# Patient Record
Sex: Female | Born: 1990 | Race: Black or African American | Hispanic: No | Marital: Single | Smoking: Never smoker
Health system: Southern US, Community
[De-identification: ages and names within clinical notes are randomized; demographics above are authoritative.]

## PROBLEM LIST (undated history)

## (undated) DIAGNOSIS — J349 Unspecified disorder of nose and nasal sinuses: Secondary | ICD-10-CM

---
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
## 2010-06-12 ENCOUNTER — Emergency Department (HOSPITAL_COMMUNITY): Admission: EM | Admit: 2010-06-12 | Discharge: 2010-06-12 | Payer: Self-pay | Admitting: Family Medicine

## 2010-12-04 ENCOUNTER — Inpatient Hospital Stay (INDEPENDENT_AMBULATORY_CARE_PROVIDER_SITE_OTHER)
Admission: RE | Admit: 2010-12-04 | Discharge: 2010-12-04 | Disposition: A | Discharge status: Home / Self Care | Payer: Self-pay | Source: Ambulatory Visit | Attending: Emergency Medicine | Admitting: Emergency Medicine

## 2010-12-04 DIAGNOSIS — B373 Candidiasis of vulva and vagina: Secondary | ICD-10-CM

## 2010-12-04 DIAGNOSIS — Z3201 Encounter for pregnancy test, result positive: Secondary | ICD-10-CM

## 2010-12-04 LAB — POCT URINALYSIS DIPSTICK
Bilirubin Urine: NEGATIVE
Glucose, UA: NEGATIVE mg/dL
Nitrite: NEGATIVE
Protein, ur: 30 mg/dL — AB
Specific Gravity, Urine: 1.025 (ref 1.005–1.030)
Urobilinogen, UA: 0.2 mg/dL (ref 0.0–1.0)
pH: 6 (ref 5.0–8.0)

## 2010-12-04 LAB — WET PREP, GENITAL: Trich, Wet Prep: NONE SEEN

## 2010-12-04 LAB — POCT PREGNANCY, URINE: Preg Test, Ur: POSITIVE

## 2010-12-05 LAB — GC/CHLAMYDIA PROBE AMP, GENITAL
Chlamydia, DNA Probe: NEGATIVE
GC Probe Amp, Genital: NEGATIVE

## 2010-12-06 LAB — WET PREP, GENITAL
Trich, Wet Prep: NONE SEEN
Yeast Wet Prep HPF POC: NONE SEEN

## 2010-12-06 LAB — POCT URINALYSIS DIPSTICK
Bilirubin Urine: NEGATIVE
Glucose, UA: NEGATIVE mg/dL
Hgb urine dipstick: NEGATIVE
Ketones, ur: 15 mg/dL — AB
pH: 6.5 (ref 5.0–8.0)

## 2010-12-06 LAB — POCT PREGNANCY, URINE: Preg Test, Ur: NEGATIVE

## 2010-12-06 LAB — GC/CHLAMYDIA PROBE AMP, GENITAL: GC Probe Amp, Genital: NEGATIVE

## 2011-06-14 LAB — WET PREP, GENITAL
Clue Cells Wet Prep HPF POC: NONE SEEN
Trich, Wet Prep: NONE SEEN
Yeast Wet Prep HPF POC: NONE SEEN

## 2011-06-14 LAB — POCT URINALYSIS DIP (DEVICE)
Bilirubin Urine: NEGATIVE
Ketones, ur: NEGATIVE
Operator id: 247071
Protein, ur: 100 — AB
Specific Gravity, Urine: 1.02

## 2011-06-14 LAB — GC/CHLAMYDIA PROBE AMP, GENITAL: GC Probe Amp, Genital: NEGATIVE

## 2011-08-09 ENCOUNTER — Emergency Department (INDEPENDENT_AMBULATORY_CARE_PROVIDER_SITE_OTHER)
Admission: EM | Admit: 2011-08-09 | Discharge: 2011-08-09 | Disposition: A | Discharge status: Home / Self Care | Payer: Self-pay | Source: Home / Self Care | Attending: Family Medicine | Admitting: Family Medicine

## 2011-08-09 DIAGNOSIS — Z331 Pregnant state, incidental: Secondary | ICD-10-CM

## 2011-08-09 DIAGNOSIS — N760 Acute vaginitis: Secondary | ICD-10-CM

## 2011-08-09 LAB — POCT URINALYSIS DIP (DEVICE)
Glucose, UA: NEGATIVE mg/dL
Hgb urine dipstick: NEGATIVE
Nitrite: NEGATIVE
Protein, ur: NEGATIVE mg/dL
Specific Gravity, Urine: 1.02 (ref 1.005–1.030)
Urobilinogen, UA: 0.2 mg/dL (ref 0.0–1.0)
pH: 7 (ref 5.0–8.0)

## 2011-08-09 LAB — WET PREP, GENITAL

## 2011-08-09 LAB — POCT PREGNANCY, URINE: Preg Test, Ur: POSITIVE

## 2011-08-09 MED ORDER — PRENATAL RX 60-1 MG PO TABS: 1.0000 | ORAL_TABLET | Freq: Every day | ORAL | Status: DC

## 2011-08-09 NOTE — ED Provider Notes (Signed)
History     CSN: [id number] Arrival date & time: 08/09/2011 12:48 PM   First MD Initiated Contact with Patient 08/09/11 1256      Chief Complaint  Patient presents with  . Vaginal Discharge    (Consider location/radiation/quality/duration/timing/severity/associated sxs/prior treatment) Patient is a 20 y.o. female presenting with vaginal discharge. The history is provided by the patient.  Vaginal Discharge This is a new problem. The current episode started more than 2 days ago. The problem occurs constantly. The problem has not changed since onset.Pertinent negatives include no abdominal pain. Associated symptoms comments: Discharge is white and creamy. No odor, itching or irritation. Current partner x 11 mos. Pt also concerned she may be pregnant. LMP 1st week of Oct. Has breast tenderness and swelling. . The symptoms are aggravated by nothing. She has tried nothing for the symptoms.    History reviewed. No pertinent past medical history.  History reviewed. No pertinent past surgical history.  No family history on file.  History  Substance Use Topics  . Smoking status: Never Smoker   . Smokeless tobacco: Not on file  . Alcohol Use: No    OB History    Grav Para Term Preterm Abortions TAB SAB Ect Mult Living                  Review of Systems  Constitutional: Negative for fever and chills.  Gastrointestinal: Negative for nausea, vomiting, abdominal pain, diarrhea and constipation.  Genitourinary: Positive for vaginal discharge. Negative for dysuria, urgency, frequency, vaginal bleeding, vaginal pain and pelvic pain.    Allergies  Review of patient's allergies indicates no known allergies.  Home Medications   Current Outpatient Rx  Name Route Sig Dispense Refill  . PRENATAL RX 60-1 MG PO TABS Oral Take 1 tablet by mouth daily. 30 tablet 0    BP 109/66  Pulse 88  Temp(Src) 97.8 F (36.6 C) (Oral)  Resp 17  SpO2 100%  LMP 07/02/2011  Physical Exam  Nursing  note and vitals reviewed. Constitutional: She appears well-developed and well-nourished. No distress.  Cardiovascular: Normal rate, regular rhythm and normal heart sounds.   Pulmonary/Chest: Effort normal and breath sounds normal. No respiratory distress.  Abdominal: Soft. Bowel sounds are normal. She exhibits no mass. There is no tenderness.  Genitourinary: Uterus normal. There is no rash, tenderness or lesion on the right labia. There is no rash, tenderness or lesion on the left labia. Cervix exhibits no motion tenderness, no discharge and no friability. Right adnexum displays no mass, no tenderness and no fullness. Left adnexum displays no mass, no tenderness and no fullness. No erythema around the vagina. No signs of injury around the vagina. Vaginal discharge found.  Skin: Skin is warm and dry.  Psychiatric: She has a normal mood and affect.    ED Course  Procedures (including critical care time)   Labs Reviewed  POCT URINALYSIS DIP (DEVICE)  POCT PREGNANCY, URINE  POCT URINALYSIS DIPSTICK  POCT PREGNANCY, URINE  GC/CHLAMYDIA PROBE AMP, GENITAL  WET PREP, GENITAL   No results found.   1. Vaginitis   2. Pregnancy as incidental finding       MDM  UA & urine preg reviewed. GC/chlamydia & wet prep pending.  Pt advised of pos preg test. She states she is planning to terminate pregnancy. Advised pt that if she reconsiders that she needs to schedule OB f/u and to begin taking PNV while considering options.        Melody Comas,  PA 08/09/11 1354  Melody Comas, PA 08/09/11 2304

## 2011-08-09 NOTE — ED Notes (Signed)
C/o vaginal discharge without odor, itching or urinary sx since 08/02/11

## 2011-08-10 LAB — GC/CHLAMYDIA PROBE AMP, GENITAL: Chlamydia, DNA Probe: NEGATIVE

## 2011-08-10 NOTE — ED Provider Notes (Signed)
Medical screening examination/treatment/procedure(s) were performed by non-physician practitioner and as supervising physician I was immediately available for consultation/collaboration.   Barkley Bruns MD.    Barkley Bruns, MD 08/10/11 [phone]

## 2012-03-12 ENCOUNTER — Emergency Department (HOSPITAL_COMMUNITY)
Admission: EM | Admit: 2012-03-12 | Discharge: 2012-03-12 | Disposition: A | Discharge status: Home / Self Care | Payer: Self-pay | Attending: Emergency Medicine | Admitting: Emergency Medicine

## 2012-03-12 DIAGNOSIS — X58XXXA Exposure to other specified factors, initial encounter: Secondary | ICD-10-CM | POA: Insufficient documentation

## 2012-03-12 DIAGNOSIS — Z91010 Allergy to peanuts: Secondary | ICD-10-CM | POA: Insufficient documentation

## 2012-03-12 DIAGNOSIS — T783XXA Angioneurotic edema, initial encounter: Secondary | ICD-10-CM | POA: Insufficient documentation

## 2012-03-12 MED ORDER — FAMOTIDINE 20 MG PO TABS: 20.0000 mg | ORAL_TABLET | Freq: Two times a day (BID) | ORAL | Status: DC

## 2012-03-12 MED ORDER — FAMOTIDINE 20 MG PO TABS
20.0000 mg | ORAL_TABLET | Freq: Once | ORAL | Status: AC
  Administered 2012-03-12: 20 mg via ORAL
  Filled 2012-03-12: qty 1

## 2012-03-12 MED ORDER — DIPHENHYDRAMINE HCL 25 MG PO CAPS
25.0000 mg | ORAL_CAPSULE | Freq: Once | ORAL | Status: AC
  Administered 2012-03-12: 25 mg via ORAL
  Filled 2012-03-12: qty 1

## 2012-03-12 MED ORDER — PREDNISONE 20 MG PO TABS
60.0000 mg | ORAL_TABLET | Freq: Once | ORAL | Status: AC
  Administered 2012-03-12: 60 mg via ORAL
  Filled 2012-03-12: qty 3

## 2012-03-12 MED ORDER — DIPHENHYDRAMINE HCL 25 MG PO CAPS: 25.0000 mg | ORAL_CAPSULE | Freq: Four times a day (QID) | ORAL | Status: DC | PRN

## 2012-03-12 NOTE — Discharge Instructions (Signed)
Continue benadryl and pepcid as prescribed for reaction. Avoid those yogurt bars. Follow up with your doctor as needed for recheck. Return if symptoms are worsening, swelling worsens, have shortness of breath, swelling of the tongue.  Angioedema Angioedema (AE) is a sudden swelling of the eyelids, lips, lobes of ears, external genitalia, skin, and other parts of the body. AE can happen by itself. It usually begins during the night and is found on awakening. It can happen with hives and other allergic reactions. Attacks can be mild and annoying, or life-threatening if the air passages swell. AE generally occurs in a short time period (over minutes to hours) and gets better in 24 to 48 hours. It usually does not cause any serious problems.  There are 2 different kinds of AE:   Allergic AE.   Nonallergic AE.   There may be an overreaction or direct stimulation of cells that are a part of the immune system (mast cells).   There may be problems with the release of chemicals made by the body that cause swelling and inflammation (kinins). AE due to kinins can be inherited from parents (hereditary), or it can develop on its own (acquired). Acquired AE either shows up before, or along with, certain diseases or is due to the body's immune system attacking parts of the body's own cells (autoimmune).  CAUSES  Allergic  AE due to allergic reactions are caused by something that causes the body to react (trigger). Common triggers include:   Foods.   Medicines.   Latex.   Direct contact with certain fruits, vegetables, or animal saliva.   Insect stings.  Nonallergic  Mast cell stimulation may be caused by:   Medicines.   Dyes used in X-rays.   The body's own immune system reactions to parts of the body (autoimmune disease).   Possibly, some virus infections.   AE due to problems with kinins can be hereditary or acquired. Attacks are triggered by:   Mild injury.   Dental work or any  surgery.   Stress.   Sudden changes in temperature.   Exercise.   Medicines.   AE due to problems with kinins can also be due to certain medicines, especially blood pressure medicines like angiotensin-converting enzyme (ACE) inhibitors. African Americans are at nearly 5 times greater risk of developing AE than Caucasians from ACE inhibitors.  SYMPTOMS  Allergic symptoms:  Non-itchy swelling of the skin. Often the swelling is on the face and lips, but any area of the skin can swell. Sometimes, the swelling can be painful. If hives are present, there is intense itching.   Breathing problems if the air passages swell.  Nonallergic symptoms:  If internal organs are involved, there may be:   Nausea.   Abdominal pain.   Vomiting.   Difficulty swallowing.   Difficulty passing urine.   Breathing problems if the air passages swell.  Depending on the cause of AE, episodes may:  Only happen once (if triggers are removed or avoided).   Come back in unpredictable patterns.   Repeat for several years and then gradually fade away.  DIAGNOSIS  AE is diagnosed by:   Asking questions to find out how fast the symptoms began.   Taking a family history.   Physical exam.   Diagnostic tests. Tests could include:   Allergy skin tests to see if the problem is allergic.   Blood tests to diagnose hereditary and some acquired types of AE.   Other tests to see if there  is a hidden disease leading to the AE.  TREATMENT  Treatment depends on the type and cause (if any) of the AE. Allergic  Allergic types of AE are treated with:   Immediate removal of the trigger or medicine (if any).   Epinephrine injection.   Steroids.   Antihistamines.   Hospitalization for severe attacks.  Nonallergic  Mast cell stimulation types of AE are treated with:   Immediate removal of the trigger or medicine (if any).   Epinephrine injection.   Steroids.   Antihistamines.    Hospitalization for severe attacks.   Hereditary AE is treated with:   Medicines to prevent and treat attacks. There is little response to antihistamines, epinephrine, or steroids.   Preventive medicines before dental work or surgery.   Removing or avoiding medicines that trigger attacks.   Hospitalization for severe attacks.   Acquired AE is treated with:   Treating underlying disease (if any).   Medicines to prevent and treat attacks.  HOME CARE INSTRUCTIONS   Always carry your emergency allergy treatment medicines with you.   Wear a medical bracelet.   Avoid known triggers.  SEEK MEDICAL CARE IF:   You get repeat attacks.   Your attacks are more frequent or more severe despite preventive measures.   You have hereditary AE and are considering having children. It is important to discuss the risks of passing this on to your children.  SEEK IMMEDIATE MEDICAL CARE IF:   You have difficulty breathing.   You have difficulty swallowing.   You experience fainting.  This condition should be treated immediately. It can be life-threatening if it involves throat swelling. Document Released: 11/18/2001 Document Revised: 08/29/2011 Document Reviewed: 09/08/2008 [facility] Patient Information 2012 [place], [place].

## 2012-03-12 NOTE — ED Provider Notes (Signed)
History     CSN: [id number]  Arrival date & time 03/12/12  0272   First MD Initiated Contact with Patient 03/12/12 [phone]      Chief Complaint  Patient presents with  . Oral Swelling    (Consider location/radiation/quality/duration/timing/severity/associated sxs/prior treatment) Patient is a 21 y.o. female presenting with allergic reaction. The history is provided by the patient.  Allergic Reaction The primary symptoms do not include shortness of breath, abdominal pain, nausea, vomiting, dizziness, rash or urticaria. The current episode started 1 to 2 hours ago. The problem has been gradually worsening. This is a new problem.  Pt states she ate a yogurt bar this morning, states about 5-10 min later started having lip swelling. State she has had that same bar before and no reaction. Denies swelling in the throat, shortness of breath. States she is allergic to peanuts with similar reaction. No other foods, no new products, was at work when this happened.   No past medical history on file.  No past surgical history on file.  No family history on file.  History  Substance Use Topics  . Smoking status: Never Smoker   . Smokeless tobacco: Not on file  . Alcohol Use: No    OB History    Grav Para Term Preterm Abortions TAB SAB Ect Mult Living                  Review of Systems  Constitutional: Negative for fever and chills.  HENT: Positive for facial swelling. Negative for drooling, mouth sores, trouble swallowing and neck stiffness.   Respiratory: Negative.  Negative for shortness of breath and stridor.   Cardiovascular: Negative.   Gastrointestinal: Negative for nausea, vomiting and abdominal pain.  Skin: Negative for rash.  Neurological: Negative for dizziness and light-headedness.    Allergies  Peanuts  Home Medications   Current Outpatient Rx  Name Route Sig Dispense Refill  . HYDROCORTISONE 1 % EX CREA Topical Apply 1 application topically 2 (two) times daily. For  eczema    . IBUPROFEN 200 MG PO TABS Oral Take 400 mg by mouth every 6 (six) hours as needed. For pain    . PRESCRIPTION MEDICATION Oral Take 1 tablet by mouth daily. Birth control      BP 101/76  Pulse 75  Temp 97.9 F (36.6 C) (Oral)  Resp 16  Ht 5\' 1"  (1.549 m)  Wt 140 lb (63.504 kg)  BMI 26.45 kg/m2  SpO2 100%  LMP 02/14/2012  Physical Exam  Nursing note and vitals reviewed. Constitutional: She appears well-developed and well-nourished. No distress.  HENT:  Head: Normocephalic and atraumatic.  Right Ear: Tympanic membrane, external ear and ear canal normal.  Left Ear: Tympanic membrane, external ear and ear canal normal.  Nose: Nose normal.  Mouth/Throat: Uvula is midline, oropharynx is clear and moist and mucous membranes are normal. No uvula swelling.    Neck: Normal range of motion. Neck supple.  Cardiovascular: Normal rate, regular rhythm and normal heart sounds.   Pulmonary/Chest: Effort normal and breath sounds normal. She has no wheezes. She has no rales.       No stridor  Lymphadenopathy:    She has no cervical adenopathy.  Neurological: She is alert.  Skin: Skin is warm and dry. No rash noted.  Psychiatric: She has a normal mood and affect.    ED Course  Procedures (including critical care time)  Pt with partial left lower lip swelling,no other symptoms. Suspect an allergic reaction. No  uvula swelling, no respiratory problems.    Pt watched for 2 hrs, swelling of the lip improved with prednisone, benadryl, pepcid. Pt's vs normal.pt stable for discharge. Instructed to return if worsening.     1. Angioedema of lips       MDM          Lottie Mussel, PA 03/12/12 1606

## 2012-03-12 NOTE — ED Notes (Signed)
Pt ate [employer] yoghurt bar at 815 am and left lower lip corner started swelling. Pt is talking, no other complaints. Pt is allergic to peanuts. Allergy is mild with some swelling in the past.

## 2012-03-12 NOTE — ED Provider Notes (Signed)
Medical screening examination/treatment/procedure(s) were performed by non-physician practitioner and as supervising physician I was immediately available for consultation/collaboration.   Jamelah Sitzer A Jackline Castilla, MD 03/12/12 1617 

## 2013-02-07 ENCOUNTER — Emergency Department (HOSPITAL_BASED_OUTPATIENT_CLINIC_OR_DEPARTMENT_OTHER)
Admission: EM | Admit: 2013-02-07 | Discharge: 2013-02-07 | Disposition: A | Discharge status: Home / Self Care | Payer: Self-pay | Attending: Emergency Medicine | Admitting: Emergency Medicine

## 2013-02-07 ENCOUNTER — Encounter (HOSPITAL_BASED_OUTPATIENT_CLINIC_OR_DEPARTMENT_OTHER): Payer: Self-pay | Admitting: Emergency Medicine

## 2013-02-07 DIAGNOSIS — J302 Other seasonal allergic rhinitis: Secondary | ICD-10-CM

## 2013-02-07 DIAGNOSIS — J3489 Other specified disorders of nose and nasal sinuses: Secondary | ICD-10-CM | POA: Insufficient documentation

## 2013-02-07 DIAGNOSIS — J3089 Other allergic rhinitis: Secondary | ICD-10-CM | POA: Insufficient documentation

## 2013-02-07 DIAGNOSIS — Z79899 Other long term (current) drug therapy: Secondary | ICD-10-CM | POA: Insufficient documentation

## 2013-02-07 LAB — RAPID STREP SCREEN (MED CTR MEBANE ONLY): Streptococcus, Group A Screen (Direct): NEGATIVE

## 2013-02-07 NOTE — ED Notes (Signed)
Pt c/o nasal congestion and sore throat since 1 am.  Denies fever.  Pt states she has sensation of something in throat on left side.

## 2013-02-07 NOTE — ED Provider Notes (Signed)
History     CSN: [id number]  Arrival date & time 02/07/13  1037   First MD Initiated Contact with Patient 02/07/13 1052      Chief Complaint  Patient presents with  . Sore Throat  . Nasal Congestion    (Consider location/radiation/quality/duration/timing/severity/associated sxs/prior treatment) HPI Comments: Patient comes to the ER for evaluation of sinus congestion, sore throat since early this morning. Patient reports that her glands were swollen under her jaw as well. She has not had any fever. There is no cough or chest congestion.  Patient is a 22 y.o. female presenting with pharyngitis.  Sore Throat    No past medical history on file.  No past surgical history on file.  No family history on file.  History  Substance Use Topics  . Smoking status: Never Smoker   . Smokeless tobacco: Not on file  . Alcohol Use: No    OB History   Grav Para Term Preterm Abortions TAB SAB Ect Mult Living                  Review of Systems  HENT: Positive for sore throat.   Respiratory: Negative for cough.     Allergies  Peanuts  Home Medications   Current Outpatient Rx  Name  Route  Sig  Dispense  Refill  . Norgestimate-Ethinyl Estradiol Triphasic (ORTHO TRI-CYCLEN LO) 0.18/0.215/0.25 MG-25 MCG tab   Oral   Take 1 tablet by mouth daily.         Marland Kitchen EXPIRED: diphenhydrAMINE (BENADRYL) 25 mg capsule   Oral   Take 1 capsule (25 mg total) by mouth every 6 (six) hours as needed for itching.   30 capsule   0   . famotidine (PEPCID) 20 MG tablet   Oral   Take 1 tablet (20 mg total) by mouth 2 (two) times daily.   30 tablet   0     BP 109/64  Pulse 93  Temp(Src) 98.5 F (36.9 C)  Resp 18  SpO2 100%  LMP 01/13/2013  Physical Exam  Constitutional: She is oriented to person, place, and time. She appears well-developed and well-nourished. No distress.  HENT:  Head: Normocephalic and atraumatic.  Right Ear: Hearing normal.  Left Ear: Hearing normal.  Nose:  Nose normal.  Mouth/Throat: Oropharynx is clear and moist and mucous membranes are normal. No oropharyngeal exudate, posterior oropharyngeal edema or tonsillar abscesses.  Eyes: Conjunctivae and EOM are normal. Pupils are equal, round, and reactive to light.  Neck: Normal range of motion. Neck supple.  Cardiovascular: Regular rhythm, S1 normal and S2 normal.  Exam reveals no gallop and no friction rub.   No murmur heard. Pulmonary/Chest: Effort normal and breath sounds normal. No respiratory distress. She exhibits no tenderness.  Abdominal: Soft. Normal appearance and bowel sounds are normal. There is no hepatosplenomegaly. There is no tenderness. There is no rebound, no guarding, no tenderness at McBurney's point and negative Murphy's sign. No hernia.  Musculoskeletal: Normal range of motion.  Neurological: She is alert and oriented to person, place, and time. She has normal strength. No cranial nerve deficit or sensory deficit. Coordination normal. GCS eye subscore is 4. GCS verbal subscore is 5. GCS motor subscore is 6.  Skin: Skin is warm, dry and intact. No rash noted. No cyanosis.  Psychiatric: She has a normal mood and affect. Her speech is normal and behavior is normal. Thought content normal.    ED Course  Procedures (including critical care time)  Labs  Reviewed  RAPID STREP SCREEN   No results found.   Diagnosis: Pharyngitis, likely allergic    MDM  She comes to the ER for evaluation of nasal congestion and sore throat for less than one day. Examination was entirely unremarkable. Rapid strep was negative. Patient does report that she has a history of seasonal allergies and likely is experiencing some pharyngitis secondary to allergic postnasal drip.       Gilda Crease, MD 02/07/13 1128

## 2013-06-28 ENCOUNTER — Encounter (HOSPITAL_BASED_OUTPATIENT_CLINIC_OR_DEPARTMENT_OTHER): Payer: Self-pay | Admitting: *Deleted

## 2013-06-28 ENCOUNTER — Emergency Department (HOSPITAL_BASED_OUTPATIENT_CLINIC_OR_DEPARTMENT_OTHER)
Admission: EM | Admit: 2013-06-28 | Discharge: 2013-06-28 | Disposition: A | Discharge status: Home / Self Care | Payer: BC Managed Care – PPO | Attending: Emergency Medicine | Admitting: Emergency Medicine

## 2013-06-28 DIAGNOSIS — IMO0002 Reserved for concepts with insufficient information to code with codable children: Secondary | ICD-10-CM | POA: Insufficient documentation

## 2013-06-28 DIAGNOSIS — J329 Chronic sinusitis, unspecified: Secondary | ICD-10-CM | POA: Insufficient documentation

## 2013-06-28 MED ORDER — AMOXICILLIN 500 MG PO CAPS: 500.0000 mg | ORAL_CAPSULE | Freq: Three times a day (TID) | ORAL | Status: DC

## 2013-06-28 NOTE — ED Provider Notes (Signed)
Medical screening examination/treatment/procedure(s) were performed by non-physician practitioner and as supervising physician I was immediately available for consultation/collaboration.   Candyce Churn, MD 06/28/13 1018

## 2013-06-28 NOTE — ED Notes (Signed)
Swelling to left side of face noticed since this am. States has seasonal allergies and has had congestion for "a while" now. No difficulty breathing.

## 2013-06-28 NOTE — Discharge Instructions (Signed)

## 2013-06-28 NOTE — ED Notes (Signed)
Pt reports swelling to left side of face and congestion since last night. Airway intact.

## 2013-06-28 NOTE — ED Provider Notes (Signed)
CSN: [id number]     Arrival date & time 06/28/13  0903 History   First MD Initiated Contact with Patient 06/28/13 [phone]     Chief Complaint  Patient presents with  . Facial Swelling  . Nasal Congestion   (Consider location/radiation/quality/duration/timing/severity/associated sxs/prior Treatment) HPI Comments: Pt states that she has had a lot of congestion for the last week and then she developed some facial swelling and pressure on the left side since yesterday;denies fever:pt states that she has history of sinusitis:denies sore throat  The history is provided by the patient. No language interpreter was used.    History reviewed. No pertinent past medical history. History reviewed. No pertinent past surgical history. No family history on file. History  Substance Use Topics  . Smoking status: Never Smoker   . Smokeless tobacco: Not on file  . Alcohol Use: No   OB History   Grav Para Term Preterm Abortions TAB SAB Ect Mult Living                 Review of Systems  Constitutional: Negative.   Respiratory: Negative.   Cardiovascular: Negative.     Allergies  Ibuprofen and Peanuts  Home Medications   Current Outpatient Rx  Name  Route  Sig  Dispense  Refill  . amoxicillin (AMOXIL) 500 MG capsule   Oral   Take 1 capsule (500 mg total) by mouth 3 (three) times daily.   30 capsule   0   . EXPIRED: diphenhydrAMINE (BENADRYL) 25 mg capsule   Oral   Take 1 capsule (25 mg total) by mouth every 6 (six) hours as needed for itching.   30 capsule   0   . EXPIRED: famotidine (PEPCID) 20 MG tablet   Oral   Take 1 tablet (20 mg total) by mouth 2 (two) times daily.   30 tablet   0   . Norgestimate-Ethinyl Estradiol Triphasic (ORTHO TRI-CYCLEN LO) 0.18/0.215/0.25 MG-25 MCG tab   Oral   Take 1 tablet by mouth daily.          BP 107/60  Pulse 90  Temp(Src) 98.2 F (36.8 C) (Oral)  Resp 16  Ht 5\' 1"  (1.549 m)  Wt 138 lb (62.596 kg)  BMI 26.09 kg/m2  SpO2 100%  LMP  06/24/2013 Physical Exam  Nursing note and vitals reviewed. Constitutional: She is oriented to person, place, and time. She appears well-developed and well-nourished.  HENT:  Right Ear: External ear normal.  Left Ear: External ear normal.  Nose: Mucosal edema and sinus tenderness present. Left sinus exhibits maxillary sinus tenderness and frontal sinus tenderness.  Eyes: Conjunctivae and EOM are normal.  Cardiovascular: Normal rate and regular rhythm.   Pulmonary/Chest: Effort normal and breath sounds normal.  Musculoskeletal: Normal range of motion.  Neurological: She is alert and oriented to person, place, and time.  Skin:  Red non crusted area to the below the nose    ED Course  Procedures (including critical care time) Labs Review Labs Reviewed - No data to display Imaging Review No results found.  MDM   1. Sinusitis    Will treat with antibiotics    Teressa Lower, NP 06/28/13 [phone]

## 2013-08-16 ENCOUNTER — Encounter (HOSPITAL_BASED_OUTPATIENT_CLINIC_OR_DEPARTMENT_OTHER): Payer: Self-pay | Admitting: Emergency Medicine

## 2013-08-16 ENCOUNTER — Emergency Department (HOSPITAL_BASED_OUTPATIENT_CLINIC_OR_DEPARTMENT_OTHER)
Admission: EM | Admit: 2013-08-16 | Discharge: 2013-08-16 | Disposition: A | Discharge status: Home / Self Care | Payer: BC Managed Care – PPO | Attending: Emergency Medicine | Admitting: Emergency Medicine

## 2013-08-16 DIAGNOSIS — Z3202 Encounter for pregnancy test, result negative: Secondary | ICD-10-CM | POA: Insufficient documentation

## 2013-08-16 DIAGNOSIS — Z792 Long term (current) use of antibiotics: Secondary | ICD-10-CM | POA: Insufficient documentation

## 2013-08-16 DIAGNOSIS — Z79899 Other long term (current) drug therapy: Secondary | ICD-10-CM | POA: Insufficient documentation

## 2013-08-16 DIAGNOSIS — B3731 Acute candidiasis of vulva and vagina: Secondary | ICD-10-CM | POA: Insufficient documentation

## 2013-08-16 DIAGNOSIS — Z8709 Personal history of other diseases of the respiratory system: Secondary | ICD-10-CM | POA: Insufficient documentation

## 2013-08-16 DIAGNOSIS — B373 Candidiasis of vulva and vagina: Secondary | ICD-10-CM

## 2013-08-16 HISTORY — DX: Unspecified disorder of nose and nasal sinuses: J34.9

## 2013-08-16 LAB — URINALYSIS, ROUTINE W REFLEX MICROSCOPIC
Bilirubin Urine: NEGATIVE
Glucose, UA: NEGATIVE mg/dL
Ketones, ur: NEGATIVE mg/dL
Nitrite: NEGATIVE
Specific Gravity, Urine: 1.025 (ref 1.005–1.030)
Urobilinogen, UA: 0.2 mg/dL (ref 0.0–1.0)
pH: 7 (ref 5.0–8.0)

## 2013-08-16 LAB — WET PREP, GENITAL
Clue Cells Wet Prep HPF POC: NONE SEEN
Trich, Wet Prep: NONE SEEN

## 2013-08-16 LAB — URINE MICROSCOPIC-ADD ON

## 2013-08-16 LAB — PREGNANCY, URINE: Preg Test, Ur: NEGATIVE

## 2013-08-16 MED ORDER — FLUCONAZOLE 50 MG PO TABS
150.0000 mg | ORAL_TABLET | Freq: Once | ORAL | Status: AC
  Administered 2013-08-16: 150 mg via ORAL
  Filled 2013-08-16 (×2): qty 1

## 2013-08-16 MED ORDER — FLUCONAZOLE 150 MG PO TABS: 150.0000 mg | ORAL_TABLET | Freq: Once | ORAL | Status: DC

## 2013-08-16 NOTE — ED Notes (Signed)
Pelvic cart set up at bedside  

## 2013-08-16 NOTE — ED Provider Notes (Signed)
CSN: [id number]     Arrival date & time 08/16/13  1537 History  This chart was scribed for Cynthia Churn, MD by Leone Payor, ED Scribe. This patient was seen in room MH06/MH06 and the patient's care was started 3:55 PM.    Chief Complaint  Patient presents with  . Vaginitis    Patient is a 22 y.o. female presenting with vaginal itching and vaginal discharge. The history is provided by the patient. No language interpreter was used.  Vaginal Itching This is a recurrent problem. The current episode started more than 2 days ago. The problem occurs constantly. The problem has been gradually worsening. Pertinent negatives include no abdominal pain. Nothing aggravates the symptoms. Nothing relieves the symptoms. She has tried nothing for the symptoms. The treatment provided no relief.  Vaginal Discharge Quality:  White Severity:  Moderate Onset quality:  Gradual Duration:  1 day Timing:  Constant Progression:  Worsening Chronicity:  Recurrent Context: recent antibiotic use   Relieved by:  Nothing Worsened by:  Nothing tried Ineffective treatments:  None tried Associated symptoms: vaginal itching   Associated symptoms: no abdominal pain     HPI Comments: Cynthia Matthews is a 22 y.o. female who presents to the Emergency Department complaining of constant, worsened vaginal pain and itching that began 5 days ago. She also reports having vaginal discharge that she describes as white and creamy that began yesterday. Pt states she was recently taking antibiotics for a sinus infection. She states she has had similar symptoms with past yeast infections. She is sexually active and has mostly protected intercourse with 1 partner. Her LNMP was November 1. She denies genital sore, abdominal pain.   Past Medical History  Diagnosis Date  . Sinus disorder    History reviewed. No pertinent past surgical history. No family history on file. History  Substance Use Topics  . Smoking status: Never  Smoker   . Smokeless tobacco: Not on file  . Alcohol Use: No   OB History   Grav Para Term Preterm Abortions TAB SAB Ect Mult Living                 Review of Systems  Gastrointestinal: Negative for abdominal pain.  Genitourinary: Positive for vaginal discharge and vaginal pain. Negative for genital sores.  All other systems reviewed and are negative.    Allergies  Ibuprofen and Peanuts  Home Medications   Current Outpatient Rx  Name  Route  Sig  Dispense  Refill  . loratadine-pseudoephedrine (CLARITIN-D 24-HOUR) 10-240 MG per 24 hr tablet   Oral   Take 1 tablet by mouth daily.         Marland Kitchen amoxicillin (AMOXIL) 500 MG capsule   Oral   Take 1 capsule (500 mg total) by mouth 3 (three) times daily.   30 capsule   0   . EXPIRED: diphenhydrAMINE (BENADRYL) 25 mg capsule   Oral   Take 1 capsule (25 mg total) by mouth every 6 (six) hours as needed for itching.   30 capsule   0   . EXPIRED: famotidine (PEPCID) 20 MG tablet   Oral   Take 1 tablet (20 mg total) by mouth 2 (two) times daily.   30 tablet   0   . Norgestimate-Ethinyl Estradiol Triphasic (ORTHO TRI-CYCLEN LO) 0.18/0.215/0.25 MG-25 MCG tab   Oral   Take 1 tablet by mouth daily.          BP 125/76  Pulse 96  Temp(Src) 98.5 F (  36.9 C) (Oral)  Resp 17  Wt 137 lb (62.143 kg)  SpO2 100%  LMP 08/02/2013 Physical Exam  Nursing note and vitals reviewed. Constitutional: She is oriented to person, place, and time. She appears well-developed and well-nourished. No distress.  HENT:  Head: Normocephalic and atraumatic.  Eyes: Conjunctivae are normal. No scleral icterus.  Neck: Neck supple.  Cardiovascular: Normal rate and intact distal pulses.   Pulmonary/Chest: Effort normal. No stridor. No respiratory distress.  Abdominal: Soft. Normal appearance. She exhibits no distension. There is no tenderness.  Genitourinary: There is no lesion on the right labia. There is no lesion on the left labia. Cervix exhibits  no motion tenderness.  No external lesions. No pelvic tenderness. No CMT. Moderate amount of white, curd-like discharge.   Neurological: She is alert and oriented to person, place, and time.  Skin: Skin is warm and dry. No rash noted.  Psychiatric: She has a normal mood and affect. Her behavior is normal.    ED Course  Procedures   DIAGNOSTIC STUDIES: Oxygen Saturation is 100% on RA, normal by my interpretation.    COORDINATION OF CARE: 4:12 PM Discussed treatment plan with pt at bedside and pt agreed to plan.    Labs Review Labs Reviewed  URINALYSIS, ROUTINE W REFLEX MICROSCOPIC - Abnormal; Notable for the following:    Leukocytes, UA LARGE (*)    All other components within normal limits  URINE MICROSCOPIC-ADD ON - Abnormal; Notable for the following:    Bacteria, UA MANY (*)    All other components within normal limits  WET PREP, GENITAL  GC/CHLAMYDIA PROBE AMP  URINE CULTURE  PREGNANCY, URINE   Imaging Review No results found.  EKG Interpretation   None       MDM   1. Vaginal yeast infection    Exam and wet prep consistent with yeast infection.    I personally performed the services described in this documentation, which was scribed in my presence. The recorded information has been reviewed and is accurate.     Cynthia Churn, MD 08/16/13 [phone]

## 2013-08-16 NOTE — ED Notes (Signed)
Pt. Reports she was just on and abx. And now has a "yeast infection".  Pt. Reports discharge and itching and sore in her vagina.

## 2013-08-17 LAB — URINE CULTURE: Culture: NO GROWTH

## 2013-08-21 ENCOUNTER — Emergency Department (HOSPITAL_BASED_OUTPATIENT_CLINIC_OR_DEPARTMENT_OTHER)
Admission: EM | Admit: 2013-08-21 | Discharge: 2013-08-21 | Disposition: A | Discharge status: Home / Self Care | Payer: BC Managed Care – PPO | Attending: Emergency Medicine | Admitting: Emergency Medicine

## 2013-08-21 ENCOUNTER — Encounter (HOSPITAL_BASED_OUTPATIENT_CLINIC_OR_DEPARTMENT_OTHER): Payer: Self-pay | Admitting: Emergency Medicine

## 2013-08-21 DIAGNOSIS — L0100 Impetigo, unspecified: Secondary | ICD-10-CM | POA: Insufficient documentation

## 2013-08-21 DIAGNOSIS — Z8709 Personal history of other diseases of the respiratory system: Secondary | ICD-10-CM | POA: Insufficient documentation

## 2013-08-21 DIAGNOSIS — Z79899 Other long term (current) drug therapy: Secondary | ICD-10-CM | POA: Insufficient documentation

## 2013-08-21 MED ORDER — MUPIROCIN CALCIUM 2 % EX CREA
TOPICAL_CREAM | Freq: Once | CUTANEOUS | Status: AC
  Administered 2013-08-21: 1 via TOPICAL

## 2013-08-21 MED ORDER — MUPIROCIN CALCIUM 2 % EX CREA: TOPICAL_CREAM | Freq: Three times a day (TID) | CUTANEOUS | Status: DC

## 2013-08-21 NOTE — ED Provider Notes (Signed)
CSN: [id number]     Arrival date & time 08/21/13  1300 History   First MD Initiated Contact with Patient 08/21/13 1400     Chief Complaint  Patient presents with  . Rash   (Consider location/radiation/quality/duration/timing/severity/associated sxs/prior Treatment) Patient is a 22 y.o. female presenting with rash.  Rash Associated symptoms: no abdominal pain, no fever, no headaches, no shortness of breath and no sore throat     21 year old female here with lesions/rash on her upper left 3 days. She states that she's struggled with cracks in her upper lip for several months. Her current lesion began as a small "pimple" 3 days ago and then overnight spread across her upper lip. At home she's been using Neosporin, alcohol, and peroxide. She states that occasionally it drains, and when the Neosporin is on it seems to drain more which worried her. She denies fever, swelling, chills, and sweats. She denies decreased by mouth intake. She works at an [employer] but does aware of any overt sick contacts.  Past Medical History  Diagnosis Date  . Sinus disorder    History reviewed. No pertinent past surgical history. History reviewed. No pertinent family history. History  Substance Use Topics  . Smoking status: Never Smoker   . Smokeless tobacco: Not on file  . Alcohol Use: No   OB History   Grav Para Term Preterm Abortions TAB SAB Ect Mult Living                 Review of Systems  Constitutional: Negative for fever, chills and appetite change.  HENT: Negative for sore throat.   Respiratory: Negative for cough and shortness of breath.   Cardiovascular: Negative for chest pain.  Gastrointestinal: Negative for abdominal pain.  Genitourinary: Negative for dysuria.  Skin: Positive for rash.  Neurological: Negative for headaches.  All other systems reviewed and are negative.    Allergies  Ibuprofen and Peanuts  Home Medications   Current Outpatient Rx  Name  Route  Sig   Dispense  Refill  . loratadine-pseudoephedrine (CLARITIN-D 24-HOUR) 10-240 MG per 24 hr tablet   Oral   Take 1 tablet by mouth daily.         . mupirocin cream (BACTROBAN) 2 %   Topical   Apply topically 3 (three) times daily.   15 g   0   . Norgestimate-Ethinyl Estradiol Triphasic (ORTHO TRI-CYCLEN LO) 0.18/0.215/0.25 MG-25 MCG tab   Oral   Take 1 tablet by mouth daily.          BP 130/89  Pulse 118  Temp(Src) 98 F (36.7 C) (Oral)  Resp 18  Ht 5\' 1"  (1.549 m)  Wt 140 lb (63.504 kg)  BMI 26.47 kg/m2  SpO2 100%  LMP 08/02/2013 Physical Exam  Constitutional: She is oriented to person, place, and time. She appears well-developed and well-nourished. No distress.  HENT:  Head: Normocephalic and atraumatic.  Eyes: EOM are normal. Pupils are equal, round, and reactive to light.  Neck: Neck supple.  Cardiovascular: Normal rate, regular rhythm and normal heart sounds.   No murmur heard. Pulmonary/Chest: Effort normal and breath sounds normal.  Abdominal: Soft. Bowel sounds are normal. There is no tenderness. There is no guarding.  Musculoskeletal: She exhibits no edema.  Neurological: She is alert and oriented to person, place, and time.  Skin: Skin is warm and dry. Rash noted. She is not diaphoretic.  Honey colored papular crusted rash across entire upper lip.   Psychiatric: She has a  normal mood and affect.    ED Course  Procedures (including critical care time) Labs Review Labs Reviewed - No data to display Imaging Review No results found.  EKG Interpretation   None       MDM   1. Impetigo    22 year old female here with impetigo. No signs of systemic involvement or invasive local spread. Discussed cessation of alcohol and peroxide but she is currently trying. We'll treat topically with mupirocin ointment 3 times a day.   Red flags provided Return for worsening symptoms  Murtis Sink, MD [facility] Family Medicine Resident, PGY-2 08/21/2013, 2:55  PM       Elenora Gamma, MD 08/21/13 [phone]

## 2013-08-21 NOTE — ED Provider Notes (Signed)
I saw and evaluated the patient, reviewed the resident's note and I agree with the findings and plan.   .Face to face Exam:  General:  Awake HEENT:  Atraumatic Resp:  Normal effort Abd:  Nondistended Neuro:No focal weakness  Nelia Shi, MD 08/21/13 1457

## 2013-08-21 NOTE — ED Notes (Signed)
Pt reports rash above her upper lip.  Reports that it has been there for several weeks.  States that she has been on antibiotics.

## 2014-11-18 ENCOUNTER — Other Ambulatory Visit: Payer: Self-pay | Admitting: Physician Assistant

## 2014-11-22 LAB — CYTOLOGY - PAP

## 2015-01-17 ENCOUNTER — Other Ambulatory Visit (HOSPITAL_COMMUNITY)
Admission: RE | Admit: 2015-01-17 | Discharge: 2015-01-17 | Disposition: A | Discharge status: Home / Self Care | Payer: 59 | Source: Ambulatory Visit | Attending: Family Medicine | Admitting: Family Medicine

## 2015-01-17 ENCOUNTER — Other Ambulatory Visit: Payer: Self-pay | Admitting: Physician Assistant

## 2015-01-17 DIAGNOSIS — R87615 Unsatisfactory cytologic smear of cervix: Secondary | ICD-10-CM | POA: Insufficient documentation

## 2015-01-19 LAB — CYTOLOGY - PAP

## 2016-02-17 ENCOUNTER — Encounter (HOSPITAL_COMMUNITY): Payer: Self-pay | Admitting: Oncology

## 2016-02-17 ENCOUNTER — Emergency Department (HOSPITAL_COMMUNITY): Payer: Worker's Compensation

## 2016-02-17 ENCOUNTER — Emergency Department (HOSPITAL_COMMUNITY)
Admission: EM | Admit: 2016-02-17 | Discharge: 2016-02-17 | Disposition: A | Discharge status: Home / Self Care | Payer: Worker's Compensation | Attending: Emergency Medicine | Admitting: Emergency Medicine

## 2016-02-17 DIAGNOSIS — Y939 Activity, unspecified: Secondary | ICD-10-CM | POA: Insufficient documentation

## 2016-02-17 DIAGNOSIS — Y990 Civilian activity done for income or pay: Secondary | ICD-10-CM | POA: Diagnosis not present

## 2016-02-17 DIAGNOSIS — W208XXA Other cause of strike by thrown, projected or falling object, initial encounter: Secondary | ICD-10-CM | POA: Insufficient documentation

## 2016-02-17 DIAGNOSIS — Y929 Unspecified place or not applicable: Secondary | ICD-10-CM | POA: Insufficient documentation

## 2016-02-17 DIAGNOSIS — Z79899 Other long term (current) drug therapy: Secondary | ICD-10-CM | POA: Insufficient documentation

## 2016-02-17 DIAGNOSIS — Z91010 Allergy to peanuts: Secondary | ICD-10-CM | POA: Insufficient documentation

## 2016-02-17 DIAGNOSIS — M7989 Other specified soft tissue disorders: Secondary | ICD-10-CM | POA: Diagnosis not present

## 2016-02-17 DIAGNOSIS — S9031XA Contusion of right foot, initial encounter: Secondary | ICD-10-CM | POA: Insufficient documentation

## 2016-02-17 DIAGNOSIS — S99921A Unspecified injury of right foot, initial encounter: Secondary | ICD-10-CM | POA: Diagnosis present

## 2016-02-17 MED ORDER — HYDROCODONE-ACETAMINOPHEN 5-325 MG PO TABS
2.0000 | ORAL_TABLET | Freq: Once | ORAL | Status: AC
  Administered 2016-02-17: 1 via ORAL
  Filled 2016-02-17: qty 2

## 2016-02-17 MED ORDER — TRAMADOL HCL 50 MG PO TABS: 50.0000 mg | ORAL_TABLET | Freq: Four times a day (QID) | ORAL | Status: DC | PRN

## 2016-02-17 NOTE — ED Provider Notes (Signed)
CSN: [id number]     Arrival date & time 02/17/16  0206 History   First MD Initiated Contact with Patient 02/17/16 0228     Chief Complaint  Patient presents with  . Foot Injury     (Consider location/radiation/quality/duration/timing/severity/associated sxs/prior Treatment) Patient is a 25 y.o. female presenting with foot injury. The history is provided by the patient. No language interpreter was used.  Foot Injury Location:  Foot Time since incident:  10 hours Injury: yes   Mechanism of injury comment:  Cheesecake pan fell on right foot Foot location:  R foot and dorsum of R foot Pain details:    Quality:  Aching and throbbing   Radiates to:  Does not radiate   Severity:  Mild   Onset quality:  Sudden   Duration:  10 hours   Timing:  Constant   Progression:  Waxing and waning Chronicity:  New Dislocation: no   Prior injury to area:  No Worsened by:  Bearing weight Ineffective treatments:  None tried Associated symptoms: swelling   Associated symptoms: no decreased ROM, no fever, no muscle weakness and no numbness   Risk factors: no frequent fractures and no known bone disorder     Past Medical History  Diagnosis Date  . Sinus disorder    History reviewed. No pertinent past surgical history. History reviewed. No pertinent family history. Social History  Substance Use Topics  . Smoking status: Never Smoker   . Smokeless tobacco: None  . Alcohol Use: No   OB History    No data available      Review of Systems  Constitutional: Negative for fever.  Musculoskeletal: Positive for joint swelling and arthralgias.  Skin: Negative for color change.  Neurological: Negative for weakness and numbness.  All other systems reviewed and are negative.   Allergies  Ibuprofen and Peanuts  Home Medications   Prior to Admission medications   Medication Sig Start Date End Date Taking? Authorizing Provider  Norgestimate-Ethinyl Estradiol Triphasic (ORTHO TRI-CYCLEN LO)  0.18/0.215/0.25 MG-25 MCG tab Take 1 tablet by mouth daily.   Yes Historical Provider, MD  mupirocin cream (BACTROBAN) 2 % Apply topically 3 (three) times daily. Patient not taking: Reported on 02/17/2016 08/21/13   Elenora Gamma, MD  traMADol (ULTRAM) 50 MG tablet Take 1 tablet (50 mg total) by mouth every 6 (six) hours as needed for moderate pain or severe pain. 02/17/16   Antony Madura, PA-C   BP 110/68 mmHg  Pulse 70  Temp(Src) 98 F (36.7 C) (Oral)  Resp 18  SpO2 100%  LMP 02/07/2016 (Exact Date)   Physical Exam  Constitutional: She is oriented to person, place, and time. She appears well-developed and well-nourished. No distress.  Nontoxic appearing  HENT:  Head: Normocephalic and atraumatic.  Eyes: Conjunctivae and EOM are normal. No scleral icterus.  Neck: Normal range of motion.  Cardiovascular: Normal rate, regular rhythm and intact distal pulses.   DP and PT pulses 2+ in the RLE  Pulmonary/Chest: Effort normal. No respiratory distress.  Musculoskeletal: Normal range of motion.       Right ankle: Normal.       Right foot: There is tenderness and swelling (mild). There is normal range of motion, normal capillary refill, no crepitus and no deformity.       Feet:  Neurological: She is alert and oriented to person, place, and time. She exhibits normal muscle tone. Coordination normal.  Sensation to light touch intact in the RLE. Patient able to wiggle  all toes.  Skin: Skin is warm and dry. No rash noted. She is not diaphoretic. No erythema. No pallor.  Psychiatric: She has a normal mood and affect. Her behavior is normal.  Nursing note and vitals reviewed.   ED Course  Procedures (including critical care time) Labs Review Labs Reviewed - No data to display  Imaging Review Dg Foot Complete Right  02/17/2016  CLINICAL DATA:  Right mid dorsal foot pain and swelling after dropping a plate on foot yesterday evening while working. EXAM: RIGHT FOOT COMPLETE - 3+ VIEW  COMPARISON:  None. FINDINGS: There is no evidence of fracture or dislocation. There is no evidence of arthropathy or other focal bone abnormality. Dorsal soft tissue edema. IMPRESSION: No fracture or dislocation. Dorsal soft tissue edema. Electronically Signed   By: Rubye OaksMelanie  Ehinger M.D.   On: 02/17/2016 03:30     I have personally reviewed and evaluated these images and lab results as part of my medical decision-making.   EKG Interpretation None      MDM   Final diagnoses:  Foot contusion, right, initial encounter    25 year old female presents to the emergency department for right foot pain after dropping a cheesecake plate on her foot. She is neurovascularly intact. X-ray negative for fracture. Symptoms consistent with foot contusion. Will manage with Ace wrap and icing as well as Tylenol. Tramadol given for severe pain. Return precautions discussed and provided. Patient discharged in satisfactory condition with no unaddressed concerns.   Filed Vitals:   02/17/16 0213  BP: 110/68  Pulse: 70  Temp: 98 F (36.7 C)  TempSrc: Oral  Resp: 18  SpO2: 100%     Antony MaduraKelly Kaston Faughn, PA-C 02/17/16 0347  Dione Boozeavid Glick, MD 02/17/16 2259

## 2016-02-17 NOTE — ED Notes (Signed)
Pt was at work when a metal plate they spin cheesecake on fell onto her right foot.  Swelling noted to right foot.  Pulses strong.  Pt has been able to walk and bear weight on right foot.

## 2016-02-17 NOTE — Discharge Instructions (Signed)
Foot Contusion A foot contusion is a deep bruise to the foot. Contusions are the result of an injury that caused bleeding under the skin. The contusion may turn blue, purple, or yellow. Minor injuries will give you a painless contusion, but more severe contusions may stay painful and swollen for a few weeks. CAUSES  A foot contusion comes from a direct blow to that area, such as a heavy object falling on the foot. SYMPTOMS   Swelling of the foot.  Discoloration of the foot.  Tenderness or soreness of the foot. DIAGNOSIS  You will have a physical exam and will be asked about your history. You may need an X-ray of your foot to look for a broken bone (fracture).  TREATMENT  An elastic wrap may be recommended to support your foot. Resting, elevating, and applying cold compresses to your foot are often the best treatments for a foot contusion. Over-the-counter medicines may also be recommended for pain control. HOME CARE INSTRUCTIONS   Put ice on the injured area.  Put ice in a plastic bag.  Place a towel between your skin and the bag.  Leave the ice on for 15-20 minutes, 03-04 times a day.  Only take over-the-counter or prescription medicines for pain, discomfort, or fever as directed by your caregiver.  If told, use an elastic wrap as directed. This can help reduce swelling. You may remove the wrap for sleeping, showering, and bathing. If your toes become numb, cold, or blue, take the wrap off and reapply it more loosely.  Elevate your foot with pillows to reduce swelling.  Try to avoid standing or walking while the foot is painful. Do not resume use until instructed by your caregiver. Then, begin use gradually. If pain develops, decrease use. Gradually increase activities that do not cause discomfort until you have normal use of your foot.  See your caregiver as directed. It is very important to keep all follow-up appointments in order to avoid any lasting problems with your foot,  including long-term (chronic) pain. SEEK IMMEDIATE MEDICAL CARE IF:   You have increased redness, swelling, or pain in your foot.  Your swelling or pain is not relieved with medicines.  You have loss of feeling in your foot or are unable to move your toes.  Your foot turns cold or blue.  You have pain when you move your toes.  Your foot becomes warm to the touch.  Your contusion does not improve in 2 days. MAKE SURE YOU:   Understand these instructions.  Will watch your condition.  Will get help right away if you are not doing well or get worse.   This information is not intended to replace advice given to you by your health care provider. Make sure you discuss any questions you have with your health care provider.   Document Released: 07/01/2006 Document Revised: 03/10/2012 Document Reviewed: 05/16/2015 Elsevier Interactive Patient Education 2016 Else[employer]  RICE for Routine Care of Injuries Many injuries can be cared for using rest, ice, compression, and elevation (RICE therapy). Using RICE therapy can help to lessen pain and swelling. It can help your body to heal. Rest Reduce your normal activities and avoid using the injured part of your body. You can go back to your normal activities when you feel okay and your doctor says it is okay. Ice Do not put ice on your bare skin.  Put ice in a plastic bag.  Place a towel between your skin and the bag.  Leave  the ice on for 20 minutes, 2-3 times a day. Do this for as long as told by your doctor. Compression Compression means putting pressure on the injured area. This can be done with an elastic bandage. If an elastic bandage has been applied:  Remove and reapply the bandage every 3-4 hours or as told by your doctor.  Make sure the bandage is not wrapped too tight. Wrap the bandage more loosely if part of your body beyond the bandage is blue, swollen, cold, painful, or loses feeling (numb).  See your doctor if the  bandage seems to make your problems worse. Elevation Elevation means keeping the injured area raised. Raise the injured area above your heart or the center of your chest if you can. WHEN SHOULD I GET HELP? You should get help if:  You keep having pain and swelling.  Your symptoms get worse. WHEN SHOULD I GET HELP RIGHT AWAY? You should get help right away if:  You have sudden bad pain at or below the area of your injury.  You have redness or more swelling around your injury.  You have tingling or numbness at or below the injury that does not go away when you take off the bandage.   This information is not intended to replace advice given to you by your health care provider. Make sure you discuss any questions you have with your health care provider.   Document Released: 02/26/2008 Document Revised: 05/31/2015 Document Reviewed: 08/17/2014 Elsevier Interactive Patient Education [employer]

## 2016-12-11 DIAGNOSIS — Z113 Encounter for screening for infections with a predominantly sexual mode of transmission: Secondary | ICD-10-CM | POA: Diagnosis not present

## 2016-12-11 DIAGNOSIS — Z3041 Encounter for surveillance of contraceptive pills: Secondary | ICD-10-CM | POA: Diagnosis not present

## 2017-01-10 DIAGNOSIS — J029 Acute pharyngitis, unspecified: Secondary | ICD-10-CM | POA: Diagnosis not present

## 2017-01-10 DIAGNOSIS — J069 Acute upper respiratory infection, unspecified: Secondary | ICD-10-CM | POA: Diagnosis not present

## 2017-03-01 ENCOUNTER — Encounter (HOSPITAL_BASED_OUTPATIENT_CLINIC_OR_DEPARTMENT_OTHER): Payer: Self-pay | Admitting: Emergency Medicine

## 2017-03-01 ENCOUNTER — Emergency Department (HOSPITAL_BASED_OUTPATIENT_CLINIC_OR_DEPARTMENT_OTHER)
Admission: EM | Admit: 2017-03-01 | Discharge: 2017-03-01 | Disposition: A | Discharge status: Home / Self Care | Payer: BLUE CROSS/BLUE SHIELD | Attending: Emergency Medicine | Admitting: Emergency Medicine

## 2017-03-01 DIAGNOSIS — L0100 Impetigo, unspecified: Secondary | ICD-10-CM | POA: Diagnosis not present

## 2017-03-01 DIAGNOSIS — L089 Local infection of the skin and subcutaneous tissue, unspecified: Secondary | ICD-10-CM | POA: Diagnosis present

## 2017-03-01 MED ORDER — MUPIROCIN CALCIUM 2 % EX CREA: 1.0000 "application " | TOPICAL_CREAM | Freq: Two times a day (BID) | CUTANEOUS | 0 refills | Status: AC

## 2017-03-01 MED ORDER — AMOXICILLIN-POT CLAVULANATE 875-125 MG PO TABS: 1.0000 | ORAL_TABLET | Freq: Two times a day (BID) | ORAL | 0 refills | Status: AC

## 2017-03-01 NOTE — Discharge Instructions (Signed)
Start using the mupirocin ointment. If symptoms not improving with this, can start the Augmentin as well. You may wish to try using a dedicated facial soap such as Aveeno, Cetaphil, etc. This may be more gentle on your face to help prevent skin drying/cracking. Follow-up with your dermatologist later this month as scheduled. Return to the ED for new or worsening symptoms.

## 2017-03-01 NOTE — ED Triage Notes (Signed)
Pt has itchy rash to upper lip. States it appears the same as impetigo she previously had.

## 2017-03-01 NOTE — ED Provider Notes (Signed)
[facility] Provider [facility]   CSN: [id number] Arrival date & time: 03/01/17  1101     History   Chief Complaint Chief Complaint  Patient presents with  . Recurrent Skin Infections    HPI Cynthia Matthews is a 26 y.o. female.  The history is provided by the patient and medical records.    26 year old female here with recurrent infection of upper lip. 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 States she usually sees her dermatologist for this, but cannot be seen there for another 2 weeks. States she has noticed some dryness and crusting of her upper lip. She's been cleaning it with felt so and using Vaseline without relief. She denies any fever or chills. No oral swelling. No difficulty swallowing.  States generally she uses mupirocin ointment with good relief, but has required oral antibiotics in the past.  Past Medical History:  Diagnosis Date  . Sinus disorder     There are no active problems to display for this patient.   History reviewed. No pertinent surgical history.  OB History    No data available       Home Medications    Prior to Admission medications   Not on File    Family History No family history on file.  Social History Social History  Substance Use Topics  . Smoking status: Never Smoker  . Smokeless tobacco: Never Used  . Alcohol use No     Allergies   Ibuprofen and Peanuts [peanut oil]   Review of Systems Review of Systems  HENT:       + skin infection upper lip  All other systems reviewed and are negative.    Physical Exam Updated Vital Signs BP 107/86 (BP Location: Right Arm)   Pulse 90   Temp 98.7 F (37.1 C) (Oral)   Resp 20   Ht 5' (1.524 m)   Wt 63.5 kg (140 lb)   LMP 02/10/2017   SpO2 100%   BMI 27.34 kg/m   Physical Exam  Constitutional: She is oriented to person, place, and time. She appears well-developed and well-nourished.  HENT:  Head: Normocephalic and atraumatic.  Mouth/Throat:  Oropharynx is clear and moist.  Skin of the upper lip does appear dry and cracked, there is some clear drainage noted, no crusting at this time, no lesions of the inner lip or tongue  Eyes: Conjunctivae and EOM are normal. Pupils are equal, round, and reactive to light.  Neck: Normal range of motion.  Cardiovascular: Normal rate, regular rhythm and normal heart sounds.   Pulmonary/Chest: Effort normal and breath sounds normal.  Abdominal: Soft. Bowel sounds are normal.  Musculoskeletal: Normal range of motion.  Neurological: She is alert and oriented to person, place, and time.  Skin: Skin is warm and dry.  Psychiatric: She has a normal mood and affect.  Nursing note and vitals reviewed.    ED Treatments / Results  Labs (all labs ordered are listed, but only abnormal results are displayed) Labs Reviewed - No data to display  EKG  EKG Interpretation None       Radiology No results found.  Procedures Procedures (including critical care time)  Medications Ordered in ED Medications - No data to display   Initial Impression / Assessment and Plan / ED Course  I have reviewed the triage vital signs and the nursing notes.  Pertinent labs & imaging results that were available during my care of the patient were reviewed by me and  considered in my medical decision making (see chart for details).  26 year old female here with concern of recurrent impetigo. Skin of her upper lip does appear dry and cracked and there is some clear drainage. There is no crusting or other lesions at this time. Patient has had good response to mupirocin in the past so we'll restart this-- can add in oral augmentin if not improving. She's also been using dove body soap on her face, suggested that she may wish to try a specific gentle face wash such as Aveeno, Cetaphil, etc. Which may be less harsh on the face and prevent drying/cracking skin.  Can follow-up with dermatology later this month as scheduled.   Discussed plan with patient, she acknowledged understanding and agreed with plan of care.  Return precautions given for new or worsening symptoms.  Final Clinical Impressions(s) / ED Diagnoses   Final diagnoses:  Impetigo    New Prescriptions New Prescriptions   AMOXICILLIN-CLAVULANATE (AUGMENTIN) 875-125 MG TABLET    Take 1 tablet by mouth every 12 (twelve) hours.   MUPIROCIN CREAM (BACTROBAN) 2 %    Apply 1 application topically 2 (two) times daily.     Garlon Hatchet, PA-C 03/01/17 1259    Linwood Dibbles, MD 03/02/17 1210

## 2017-03-11 DIAGNOSIS — L0100 Impetigo, unspecified: Secondary | ICD-10-CM | POA: Diagnosis not present

## 2017-03-11 DIAGNOSIS — L309 Dermatitis, unspecified: Secondary | ICD-10-CM | POA: Diagnosis not present

## 2017-03-29 ENCOUNTER — Emergency Department (HOSPITAL_BASED_OUTPATIENT_CLINIC_OR_DEPARTMENT_OTHER)
Admission: EM | Admit: 2017-03-29 | Discharge: 2017-03-29 | Disposition: A | Discharge status: Home / Self Care | Payer: BLUE CROSS/BLUE SHIELD | Attending: Emergency Medicine | Admitting: Emergency Medicine

## 2017-03-29 ENCOUNTER — Encounter (HOSPITAL_BASED_OUTPATIENT_CLINIC_OR_DEPARTMENT_OTHER): Payer: Self-pay | Admitting: Emergency Medicine

## 2017-03-29 DIAGNOSIS — Z91010 Allergy to peanuts: Secondary | ICD-10-CM | POA: Diagnosis not present

## 2017-03-29 DIAGNOSIS — Z79899 Other long term (current) drug therapy: Secondary | ICD-10-CM | POA: Diagnosis not present

## 2017-03-29 DIAGNOSIS — L0100 Impetigo, unspecified: Secondary | ICD-10-CM | POA: Diagnosis not present

## 2017-03-29 DIAGNOSIS — R21 Rash and other nonspecific skin eruption: Secondary | ICD-10-CM | POA: Diagnosis present

## 2017-03-29 MED ORDER — CEPHALEXIN 500 MG PO CAPS: 500.0000 mg | ORAL_CAPSULE | Freq: Four times a day (QID) | ORAL | 0 refills | Status: DC

## 2017-03-29 NOTE — ED Triage Notes (Signed)
Pt tx for impetigo. Finished abx, rash persists.

## 2017-03-29 NOTE — Discharge Instructions (Signed)
Call your dermatologist on Monday.

## 2017-03-29 NOTE — ED Provider Notes (Signed)
[facility]   CSN: [id number] Arrival date & time: 03/29/17  [id number]     History   Chief Complaint Chief Complaint  Patient presents with  . Rash    HPI Cynthia Matthews is a 26 y.o. female.  26 yo F with a chief complaint of a rash. This was noticed this morning. Patient has a history of recurrent impetigo. She denies fevers or chills. Denies cough or congestion. Denies sore throat. She has had multiple bouts of this usually requiring oral antibiotics. She has mupirocin at home and has been applying it without improvement. Has tried amoxicillin in the past but gave her a rash all over her body. Sees a dermatologist.   The history is provided by the patient.  Rash   This is a recurrent problem. The current episode started 1 to 2 hours ago. The problem has not changed since onset.The problem is associated with nothing. There has been no fever. The rash is present on the face. The pain is at a severity of 2/10. The patient is experiencing no pain. The pain has been constant since onset. Associated symptoms include blisters, pain and weeping. She has tried antibiotic cream for the symptoms. The treatment provided no relief.    Past Medical History:  Diagnosis Date  . Sinus disorder     There are no active problems to display for this patient.   History reviewed. No pertinent surgical history.  OB History    No data available       Home Medications    Prior to Admission medications   Medication Sig Start Date End Date Taking? Authorizing Provider  amoxicillin-clavulanate (AUGMENTIN) 875-125 MG tablet Take 1 tablet by mouth every 12 (twelve) hours. 03/01/17   Garlon HatchetSanders, Lisa M, PA-C  cephALEXin (KEFLEX) 500 MG capsule Take 1 capsule (500 mg total) by mouth 4 (four) times daily. 03/29/17   Melene PlanFloyd, Mekaela Azizi, DO  mupirocin cream (BACTROBAN) 2 % Apply 1 application topically 2 (two) times daily. 03/01/17   Garlon HatchetSanders, Lisa M, PA-C    Family History No family history  on file.  Social History Social History  Substance Use Topics  . Smoking status: Never Smoker  . Smokeless tobacco: Never Used  . Alcohol use No     Allergies   Ibuprofen; Peanuts [peanut oil]; and Amoxicillin-pot clavulanate   Review of Systems Review of Systems  Constitutional: Negative for chills and fever.  HENT: Negative for congestion and rhinorrhea.   Eyes: Negative for redness and visual disturbance.  Respiratory: Negative for shortness of breath and wheezing.   Cardiovascular: Negative for chest pain and palpitations.  Gastrointestinal: Negative for nausea and vomiting.  Genitourinary: Negative for dysuria and urgency.  Musculoskeletal: Negative for arthralgias and myalgias.  Skin: Positive for rash. Negative for pallor and wound.  Neurological: Negative for dizziness and headaches.     Physical Exam Updated Vital Signs BP 115/79 (BP Location: Right Arm)   Pulse 88   Temp 98.3 F (36.8 C) (Oral)   Resp 16   Ht 5' (1.524 m)   Wt 63.5 kg (140 lb)   LMP 03/25/2017   SpO2 100%   BMI 27.34 kg/m   Physical Exam  Constitutional: She is oriented to person, place, and time. She appears well-developed and well-nourished. No distress.  HENT:  Head: Normocephalic and atraumatic.  Erythema with honey-colored crust below the nose just above the lip  Eyes: EOM are normal. Pupils are equal, round, and reactive to light.  Neck: Normal range of motion. Neck supple.  Cardiovascular: Normal rate and regular rhythm.  Exam reveals no gallop and no friction rub.   No murmur heard. Pulmonary/Chest: Effort normal. She has no wheezes. She has no rales.  Abdominal: Soft. She exhibits no distension. There is no tenderness.  Musculoskeletal: She exhibits no edema or tenderness.  Neurological: She is alert and oriented to person, place, and time.  Skin: Skin is warm and dry. She is not diaphoretic.  Psychiatric: She has a normal mood and affect. Her behavior is normal.  Nursing  note and vitals reviewed.    ED Treatments / Results  Labs (all labs ordered are listed, but only abnormal results are displayed) Labs Reviewed - No data to display  EKG  EKG Interpretation None       Radiology No results found.  Procedures Procedures (including critical care time)  Medications Ordered in ED Medications - No data to display   Initial Impression / Assessment and Plan / ED Course  I have reviewed the triage vital signs and the nursing notes.  Pertinent labs & imaging results that were available during my care of the patient were reviewed by me and considered in my medical decision making (see chart for details).     26 yo F Clinically with impetigo. She normally responds to Keflex. Will provide a prescription. Dermatology follow-up.  10:18 AM:  I have discussed the diagnosis/risks/treatment options with the patient and family and believe the pt to be eligible for discharge home to follow-up with PCP. We also discussed returning to the ED immediately if new or worsening sx occur. We discussed the sx which are most concerning (e.g., sudden worsening pain, fever, inability to tolerate by mouth) that necessitate immediate return. Medications administered to the patient during their visit and any new prescriptions provided to the patient are listed below.  Medications given during this visit Medications - No data to display   The patient appears reasonably screen and/or stabilized for discharge and I doubt any other medical condition or other [facility] requiring further screening, evaluation, or treatment in the ED at this time prior to discharge.    Final Clinical Impressions(s) / ED Diagnoses   Final diagnoses:  Impetigo    New Prescriptions Current Discharge Medication List    START taking these medications   Details  cephALEXin (KEFLEX) 500 MG capsule Take 1 capsule (500 mg total) by mouth 4 (four) times daily. Qty: 40 capsule, Refills: 0           Melene Plan, DO 03/29/17 1018

## 2017-08-26 DIAGNOSIS — L509 Urticaria, unspecified: Secondary | ICD-10-CM | POA: Diagnosis not present

## 2017-08-26 DIAGNOSIS — L710 Perioral dermatitis: Secondary | ICD-10-CM | POA: Diagnosis not present

## 2017-10-15 DIAGNOSIS — L700 Acne vulgaris: Secondary | ICD-10-CM | POA: Diagnosis not present

## 2018-06-01 DIAGNOSIS — Z23 Encounter for immunization: Secondary | ICD-10-CM | POA: Diagnosis not present

## 2018-06-01 DIAGNOSIS — Z0000 Encounter for general adult medical examination without abnormal findings: Secondary | ICD-10-CM | POA: Diagnosis not present

## 2018-06-01 DIAGNOSIS — Z01419 Encounter for gynecological examination (general) (routine) without abnormal findings: Secondary | ICD-10-CM | POA: Diagnosis not present

## 2018-06-01 DIAGNOSIS — Z113 Encounter for screening for infections with a predominantly sexual mode of transmission: Secondary | ICD-10-CM | POA: Diagnosis not present

## 2018-06-01 DIAGNOSIS — Z111 Encounter for screening for respiratory tuberculosis: Secondary | ICD-10-CM | POA: Diagnosis not present

## 2018-06-01 DIAGNOSIS — Z13220 Encounter for screening for lipoid disorders: Secondary | ICD-10-CM | POA: Diagnosis not present

## 2018-10-16 DIAGNOSIS — B373 Candidiasis of vulva and vagina: Secondary | ICD-10-CM | POA: Diagnosis not present

## 2018-10-16 DIAGNOSIS — Z30011 Encounter for initial prescription of contraceptive pills: Secondary | ICD-10-CM | POA: Diagnosis not present

## 2018-10-16 DIAGNOSIS — Z113 Encounter for screening for infections with a predominantly sexual mode of transmission: Secondary | ICD-10-CM | POA: Diagnosis not present

## 2019-06-10 ENCOUNTER — Other Ambulatory Visit: Payer: Self-pay

## 2019-06-10 DIAGNOSIS — Z20822 Contact with and (suspected) exposure to covid-19: Secondary | ICD-10-CM

## 2019-06-12 LAB — NOVEL CORONAVIRUS, NAA: SARS-CoV-2, NAA: NOT DETECTED

## 2019-06-26 ENCOUNTER — Encounter (HOSPITAL_BASED_OUTPATIENT_CLINIC_OR_DEPARTMENT_OTHER): Payer: Self-pay | Admitting: *Deleted

## 2019-06-26 ENCOUNTER — Other Ambulatory Visit: Payer: Self-pay

## 2019-06-26 ENCOUNTER — Emergency Department (HOSPITAL_BASED_OUTPATIENT_CLINIC_OR_DEPARTMENT_OTHER)
Admission: EM | Admit: 2019-06-26 | Discharge: 2019-06-26 | Disposition: A | Discharge status: Home / Self Care | Payer: 59 | Attending: Emergency Medicine | Admitting: Emergency Medicine

## 2019-06-26 DIAGNOSIS — H9201 Otalgia, right ear: Secondary | ICD-10-CM | POA: Insufficient documentation

## 2019-06-26 DIAGNOSIS — J029 Acute pharyngitis, unspecified: Secondary | ICD-10-CM

## 2019-06-26 DIAGNOSIS — U071 COVID-19: Secondary | ICD-10-CM | POA: Insufficient documentation

## 2019-06-26 DIAGNOSIS — R070 Pain in throat: Secondary | ICD-10-CM | POA: Insufficient documentation

## 2019-06-26 DIAGNOSIS — G501 Atypical facial pain: Secondary | ICD-10-CM | POA: Insufficient documentation

## 2019-06-26 DIAGNOSIS — Z91010 Allergy to peanuts: Secondary | ICD-10-CM | POA: Insufficient documentation

## 2019-06-26 LAB — GROUP A STREP BY PCR: Group A Strep by PCR: NOT DETECTED

## 2019-06-26 LAB — MONONUCLEOSIS SCREEN: Mono Screen: NEGATIVE

## 2019-06-26 MED ORDER — DEXAMETHASONE 6 MG PO TABS
10.0000 mg | ORAL_TABLET | Freq: Once | ORAL | Status: AC
  Administered 2019-06-26: 10 mg via ORAL
  Filled 2019-06-26: qty 1

## 2019-06-26 MED ORDER — ACETAMINOPHEN 500 MG PO TABS
1000.0000 mg | ORAL_TABLET | Freq: Once | ORAL | Status: AC
  Administered 2019-06-26: 1000 mg via ORAL
  Filled 2019-06-26: qty 2

## 2019-06-26 MED ORDER — LIDOCAINE VISCOUS HCL 2 % MT SOLN: 15.0000 mL | OROMUCOSAL | 0 refills | Status: AC | PRN

## 2019-06-26 MED ORDER — BUPIVACAINE-EPINEPHRINE (PF) 0.5% -1:200000 IJ SOLN
INTRAMUSCULAR | Status: AC
  Filled 2019-06-26: qty 1.8

## 2019-06-26 NOTE — ED Provider Notes (Signed)
[place] EMERGENCY DEPARTMENT Provider Note   CSN: [id number] Arrival date & time: 06/26/19  2140     History   Chief Complaint Chief Complaint  Patient presents with  . Sore Throat    HPI Cynthia Matthews is a 28 y.o. female with medical history as listed below presents to emergency part today with chief complaint of sore throat x3 days.  Patient states her throat feels scratchy when swallowing.  She is also having pain on the right side of her face that radiates to her right ear. She describes the pain as aching. She rates pain 4/10 in severity She has been checking her temperature daily and denies fever. She last took tylenol at 10am this morning.  She denies congestion, chest pain, shortness of breath, abdominal pain, nausea, vomiting, urinary symptoms, diarrhea.  Also denies any sick contacts or contact with anyone positive for COVID-19. History provided by patient with additional history obtained from chart review.      Past Medical History:  Diagnosis Date  . Sinus disorder     There are no active problems to display for this patient.   History reviewed. No pertinent surgical history.   OB History   No obstetric history on file.      Home Medications    Prior to Admission medications   Medication Sig Start Date End Date Taking? Authorizing Provider  amoxicillin-clavulanate (AUGMENTIN) 875-125 MG tablet Take 1 tablet by mouth every 12 (twelve) hours. 03/01/17   Larene Pickett, PA-C  cephALEXin (KEFLEX) 500 MG capsule Take 1 capsule (500 mg total) by mouth 4 (four) times daily. 03/29/17   Deno Etienne, DO  lidocaine (XYLOCAINE) 2 % solution Use as directed 15 mLs in the mouth or throat as needed for mouth pain. 06/26/19   Albrizze, Kaitlyn E, PA-C  mupirocin cream (BACTROBAN) 2 % Apply 1 application topically 2 (two) times daily. 03/01/17   Larene Pickett, PA-C    Family History No family history on file.  Social History Social History   Tobacco Use   . Smoking status: Never Smoker  . Smokeless tobacco: Never Used  Substance Use Topics  . Alcohol use: Not Currently  . Drug use: No     Allergies   Ibuprofen, Peanuts [peanut oil], and Amoxicillin-pot clavulanate   Review of Systems Review of Systems  Constitutional: Negative for chills, fatigue and fever.  HENT: Positive for ear pain and sore throat. Negative for congestion, drooling, ear discharge, sinus pressure, sinus pain, trouble swallowing and voice change.   Eyes: Negative for pain and redness.  Respiratory: Negative for cough and shortness of breath.   Cardiovascular: Negative for chest pain.  Gastrointestinal: Negative for abdominal pain, constipation, diarrhea, nausea and vomiting.  Genitourinary: Negative for dysuria and hematuria.  Musculoskeletal: Negative for back pain and neck pain.  Skin: Negative for wound.  Neurological: Negative for weakness, numbness and headaches.     Physical Exam Updated Vital Signs BP 132/89 (BP Location: Right Arm)   Pulse (!) 108   Temp (!) 101.6 F (38.7 C) (Oral)   Resp 18   Ht 5\' 1"  (1.549 m)   Wt 68 kg   LMP 06/21/2019   SpO2 100%   BMI 28.34 kg/m   Physical Exam Vitals signs and nursing note reviewed.  Constitutional:      General: She is not in acute distress.    Appearance: She is not ill-appearing.  HENT:     Head: Normocephalic and atraumatic.  Comments: No sinus or temporal tenderness.    Right Ear: Tympanic membrane, ear canal and external ear normal. Tympanic membrane is not erythematous.     Left Ear: Tympanic membrane, ear canal and external ear normal. Tympanic membrane is not erythematous.     Nose: Nose normal.     Mouth/Throat:     Mouth: Mucous membranes are moist.     Pharynx: Oropharynx is clear.     Tonsils: No tonsillar exudate or tonsillar abscesses. 2+ on the right. 2+ on the left.  Eyes:     General: No scleral icterus.       Right eye: No discharge.        Left eye: No discharge.      Extraocular Movements: Extraocular movements intact.     Conjunctiva/sclera: Conjunctivae normal.     Pupils: Pupils are equal, round, and reactive to light.  Neck:     Musculoskeletal: Normal range of motion. No muscular tenderness.     Vascular: No JVD.     Comments: No meningeal signs Cardiovascular:     Rate and Rhythm: Normal rate and regular rhythm.     Pulses: Normal pulses.          Radial pulses are 2+ on the right side and 2+ on the left side.     Heart sounds: Normal heart sounds.  Pulmonary:     Comments: Lungs clear to auscultation in all fields. Symmetric chest rise. No wheezing, rales, or rhonchi. Abdominal:     Comments: Abdomen is soft, non-distended, and non-tender in all quadrants. No rigidity, no guarding. No peritoneal signs.  Musculoskeletal: Normal range of motion.  Lymphadenopathy:     Cervical: No cervical adenopathy.  Skin:    General: Skin is warm and dry.     Capillary Refill: Capillary refill takes less than 2 seconds.  Neurological:     Mental Status: She is oriented to person, place, and time.     GCS: GCS eye subscore is 4. GCS verbal subscore is 5. GCS motor subscore is 6.     Comments: Fluent speech, no facial droop.  Psychiatric:        Behavior: Behavior normal.      ED Treatments / Results  Labs (all labs ordered are listed, but only abnormal results are displayed) Labs Reviewed  GROUP A STREP BY PCR  SARS CORONAVIRUS 2 (TAT 6-24 HRS)  MONONUCLEOSIS SCREEN    EKG None  Radiology No results found.  Procedures Procedures (including critical care time)  Medications Ordered in ED Medications  acetaminophen (TYLENOL) tablet 1,000 mg (1,000 mg Oral Given 06/26/19 2205)  dexamethasone (DECADRON) tablet 10 mg (10 mg Oral Given 06/26/19 2257)     Initial Impression / Assessment and Plan / ED Course  I have reviewed the triage vital signs and the nursing notes.  Pertinent labs & imaging results that were available during my care of  the patient were reviewed by me and considered in my medical decision making (see chart for details).  28 yo female who presents with  sore throat. Still able to tolerate PO/secretions but with worsening pain. Patient is febrile and tachycardic. She is non-toxic appearing, sitting comfortably on examination table. On exam, there is erythema to posterior oropharynx with 2+ bilateral tonsillar swelling without exudate. Presentation not concerning for PTA or Ludwig's angina, Uvulitis, epiglottitis, peritonsillar abscess, or retropharyngeal abscess. Strep ordered at triage.  Tylenol given for fever.  She is tolerating fluids and has been able to  drink several cups of water while in the ED.  Decadron also given.  Strep and mono spot are negative. Discussed results with patient.  Send out covid test performed.  Patient aware she will need to self quarantine until she has the result.  Encouraged at home supportive care measures. Pt does not appear dehydrated, but did discuss importance of water rehydration. Patient had ample opportunity for questions and discussion. All patient's questions were answered with full understanding. Patient expresses understanding and agreement to plan. Patient successfully fluid challenged in the ED without difficulty swallowing.  Strict return precautions given. NAD.  I rechecked patient's vital signs, she is afebrile and tachycardia has resolved. Recommended PCP follow up for re-evaluation.    Gareth MorganSharea M Barthold was evaluated in Emergency Department on 06/26/2019 for the symptoms described in the history of present illness. She was evaluated in the context of the global COVID-19 pandemic, which necessitated consideration that the patient might be at risk for infection with the SARS-CoV-2 virus that causes COVID-19. Institutional protocols and algorithms that pertain to the evaluation of patients at risk for COVID-19 are in a state of rapid change based on information released by  regulatory bodies including the CDC and federal and state organizations. These policies and algorithms were followed during the patient's care in the ED.    Final Clinical Impressions(s) / ED Diagnoses   Final diagnoses:  Sore throat    ED Discharge Orders         Ordered    lidocaine (XYLOCAINE) 2 % solution  As needed     06/26/19 2312           Sherene Sireslbrizze, Kaitlyn E, PA-C 06/26/19 2344    Jacalyn LefevreHaviland, Julie, MD 06/27/19 1515

## 2019-06-26 NOTE — Discharge Instructions (Addendum)
Your strep test today was negative.  Most sore throats are caused by viruses.  Continue to swish and spit with warm salt water as needed.  You can swallow 15 mL of viscous lidocaine every 3 hours as needed for sore throat.  Take 600 mg of ibuprofen with food or 650 mg of Tylenol once every 6 hours for pain.  Eating hot and cold food and beverages may be less painful than room temperature food and beverages.

## 2019-06-26 NOTE — ED Triage Notes (Signed)
Pt reports right ear, right side face, and throat pain since Thursday. She took tylenol this morning at 10

## 2019-06-27 LAB — SARS CORONAVIRUS 2 (TAT 6-24 HRS): SARS Coronavirus 2: POSITIVE — AB

## 2019-07-13 ENCOUNTER — Emergency Department (HOSPITAL_BASED_OUTPATIENT_CLINIC_OR_DEPARTMENT_OTHER)
Admission: EM | Admit: 2019-07-13 | Discharge: 2019-07-13 | Disposition: A | Discharge status: Home / Self Care | Payer: Medicaid Other | Attending: Emergency Medicine | Admitting: Emergency Medicine

## 2019-07-13 ENCOUNTER — Other Ambulatory Visit: Payer: Self-pay

## 2019-07-13 ENCOUNTER — Encounter (HOSPITAL_BASED_OUTPATIENT_CLINIC_OR_DEPARTMENT_OTHER): Payer: Self-pay | Admitting: *Deleted

## 2019-07-13 DIAGNOSIS — L0100 Impetigo, unspecified: Secondary | ICD-10-CM | POA: Insufficient documentation

## 2019-07-13 MED ORDER — CEPHALEXIN 250 MG PO CAPS
500.0000 mg | ORAL_CAPSULE | Freq: Once | ORAL | Status: AC
  Administered 2019-07-13: 500 mg via ORAL
  Filled 2019-07-13: qty 2

## 2019-07-13 MED ORDER — CEPHALEXIN 500 MG PO CAPS: 500.0000 mg | ORAL_CAPSULE | Freq: Four times a day (QID) | ORAL | 0 refills | Status: AC

## 2019-07-13 NOTE — ED Triage Notes (Signed)
States she has impetigo.

## 2019-07-13 NOTE — Discharge Instructions (Signed)
You were seen in the emergency department today with intertrigo type lesion.  Please use your topical medication at home along with the antibiotic prescribed.  I have given your first dose tonight.  You can pick up the additional antibiotic at the pharmacy tomorrow.  Please call your dermatologist for follow-up as needed.

## 2019-07-13 NOTE — ED Provider Notes (Signed)
   Emergency Department Provider Note   I have reviewed the triage vital signs and the nursing notes.   HISTORY  Chief Complaint Rash   HPI TARAE WOODEN is a 28 y.o. female with PMH impetigo presents to the ED with lip rash. She reports multiple prior episodes of impetigo requiring topical and PO abx. She follows with a dermatologist. She has had runny nose and develops the rash 2 days prior. Runny rose is frequently a trigger for this in the past. No fever, chills, or other lesions. No radiation of symptoms or modifying factors.    Past Medical History:  Diagnosis Date  . Sinus disorder     There are no active problems to display for this patient.   History reviewed. No pertinent surgical history.  Allergies Ibuprofen, Peanuts [peanut oil], and Amoxicillin-pot clavulanate  No family history on file.  Social History Social History   Tobacco Use  . Smoking status: Never Smoker  . Smokeless tobacco: Never Used  Substance Use Topics  . Alcohol use: Not Currently  . Drug use: No    Review of Systems  Constitutional: No fever/chills Skin: Positive rash.   10-point ROS otherwise negative.  ____________________________________________   PHYSICAL EXAM:  VITAL SIGNS: ED Triage Vitals  Enc Vitals Group     BP 07/13/19 2220 (!) 128/99     Pulse Rate 07/13/19 2220 87     Resp 07/13/19 2220 18     Temp 07/13/19 2220 98.7 F (37.1 C)     Temp Source 07/13/19 2220 Oral     SpO2 07/13/19 2220 95 %     Weight 07/13/19 2217 150 lb (68 kg)     Height 07/13/19 2217 5\' 1"  (1.549 m)   Constitutional: Alert and oriented. Well appearing and in no acute distress. Eyes: Conjunctivae are normal. Head: Atraumatic. Nose: No congestion/rhinnorhea. Mouth/Throat: Mucous membranes are moist.  Neck: No stridor. Cardiovascular: Normal rate, regular rhythm.  Respiratory: Normal respiratory effort.  Gastrointestinal: No distention.  Musculoskeletal:  No gross deformities  of extremities. Neurologic:  Normal speech and language. Skin: small 0.5 x 1 cm rash over the upper lip with honey crusted quality consistent with impetigo.   ____________________________________________   PROCEDURES  Procedure(s) performed:   Procedures  None ____________________________________________   INITIAL IMPRESSION / ASSESSMENT AND PLAN / ED COURSE  Pertinent labs & imaging results that were available during my care of the patient were reviewed by me and considered in my medical decision making (see chart for details).   Patient with frequent impetigo rash in the past presents with similar. Has topical abx at home and will start to use this. Added Keflex here. Patient to f/u with dermatology PRN.   ____________________________________________  FINAL CLINICAL IMPRESSION(S) / ED DIAGNOSES  Final diagnoses:  Impetigo     MEDICATIONS GIVEN DURING THIS VISIT:  Medications  cephALEXin (KEFLEX) capsule 500 mg (500 mg Oral Given 07/13/19 2246)    Note:  This document was prepared using Dragon voice recognition software and may include unintentional dictation errors.  Nanda Quinton, MD, [facility] Emergency Medicine    , [facility], MD 07/14/19 1352

## 2019-07-23 ENCOUNTER — Emergency Department (HOSPITAL_BASED_OUTPATIENT_CLINIC_OR_DEPARTMENT_OTHER)
Admission: EM | Admit: 2019-07-23 | Discharge: 2019-07-23 | Disposition: A | Discharge status: Home / Self Care | Payer: BC Managed Care – PPO | Attending: Emergency Medicine | Admitting: Emergency Medicine

## 2019-07-23 ENCOUNTER — Other Ambulatory Visit: Payer: Self-pay

## 2019-07-23 ENCOUNTER — Encounter (HOSPITAL_BASED_OUTPATIENT_CLINIC_OR_DEPARTMENT_OTHER): Payer: Self-pay | Admitting: *Deleted

## 2019-07-23 DIAGNOSIS — R0989 Other specified symptoms and signs involving the circulatory and respiratory systems: Secondary | ICD-10-CM | POA: Diagnosis not present

## 2019-07-23 DIAGNOSIS — R21 Rash and other nonspecific skin eruption: Secondary | ICD-10-CM | POA: Insufficient documentation

## 2019-07-23 DIAGNOSIS — Z91010 Allergy to peanuts: Secondary | ICD-10-CM | POA: Diagnosis not present

## 2019-07-23 DIAGNOSIS — L0100 Impetigo, unspecified: Secondary | ICD-10-CM | POA: Diagnosis not present

## 2019-07-23 NOTE — ED Provider Notes (Signed)
[facility] EMERGENCY DEPARTMENT Provider Note   CSN: [id number] Arrival date & time: 07/23/19  1154     History   Chief Complaint Chief Complaint  Patient presents with  . Rash    HPI Cynthia Matthews is a 28 y.o. female presented to the emergency department with runny nose.  Patient states her runny nose tends to be worse during is when her seasonal allergies are aggravated.  When she has runny nose like this it causes her to have episodes of impetigo and has had multiple recurrent episodes of impetigo.  She was just evaluated on 07/13/2019 and prescribed p.o. Keflex x7 days.  She has also been applying mupirocin.  She has a dermatologist she sees for this recurrent impetigo.  She presents today to the ED for recommendations regarding ways to help her runny nose as this tends to be her trigger for her impetigo.  She takes Benadryl daily.     The history is provided by the patient and medical records.    Past Medical History:  Diagnosis Date  . Sinus disorder     There are no active problems to display for this patient.   History reviewed. No pertinent surgical history.   OB History   No obstetric history on file.      Home Medications    Prior to Admission medications   Medication Sig Start Date End Date Taking? Authorizing Provider  amoxicillin-clavulanate (AUGMENTIN) 875-125 MG tablet Take 1 tablet by mouth every 12 (twelve) hours. 03/01/17   Garlon Hatchet, PA-C  lidocaine (XYLOCAINE) 2 % solution Use as directed 15 mLs in the mouth or throat as needed for mouth pain. 06/26/19   Albrizze, Kaitlyn E, PA-C  mupirocin cream (BACTROBAN) 2 % Apply 1 application topically 2 (two) times daily. 03/01/17   Garlon Hatchet, PA-C    Family History No family history on file.  Social History Social History   Tobacco Use  . Smoking status: Never Smoker  . Smokeless tobacco: Never Used  Substance Use Topics  . Alcohol use: Not Currently  . Drug use: No     Allergies   Ibuprofen, Peanuts [peanut oil], and Amoxicillin-pot clavulanate   Review of Systems Review of Systems  HENT: Positive for rhinorrhea.   Skin: Positive for rash.     Physical Exam Updated Vital Signs BP 110/80   Pulse 79   Temp 98.2 F (36.8 C) (Oral)   Resp 18   Ht 5\' 1"  (1.549 m)   Wt 68 kg   LMP 07/16/2019   SpO2 100%   BMI 28.33 kg/m   Physical Exam Vitals signs and nursing note reviewed.  Constitutional:      General: She is not in acute distress.    Appearance: She is well-developed.  HENT:     Head: Normocephalic and atraumatic.     Nose: Nose normal.  Eyes:     Conjunctiva/sclera: Conjunctivae normal.  Cardiovascular:     Rate and Rhythm: Normal rate.  Pulmonary:     Effort: Pulmonary effort is normal.  Skin:    Comments: Dry appearing erythematous maculopapular rash between the upper lip and nose.  There are no vesicles and no crusting present.  Neurological:     Mental Status: She is alert.  Psychiatric:        Mood and Affect: Mood normal.        Behavior: Behavior normal.      ED Treatments / Results  Labs (all labs  ordered are listed, but only abnormal results are displayed) Labs Reviewed - No data to display  EKG None  Radiology No results found.  Procedures Procedures (including critical care time)  Medications Ordered in ED Medications - No data to display   Initial Impression / Assessment and Plan / ED Course  I have reviewed the triage vital signs and the nursing notes.  Pertinent labs & imaging results that were available during my care of the patient were reviewed by me and considered in my medical decision making (see chart for details).        Patient presenting to the emergency department requesting advice for preventing nose from running as this causes recurrent episodes of her impetigo.  Rash appears to be healing well, do not believe further oral antibiotics are indicated at this time.  She is followed  by dermatology and PCP.  Discussed symptomatic management including Mucinex, nasal sprays.  Encouraged to follow-up outpatient for further management.  Safe for discharge.  Final Clinical Impressions(s) / ED Diagnoses   Final diagnoses:  Impetigo  Runny nose    ED Discharge Orders    None       Taysia Rivere, [place] N, PA-C 07/23/19 1244    Hayden Rasmussen, MD 07/23/19 1924

## 2019-07-23 NOTE — ED Triage Notes (Signed)
States she has impetigo on her lip.

## 2019-07-23 NOTE — Discharge Instructions (Signed)
We recommend you follow up with your dermatologist on Monday for further management.  You can try a mucinex or flonase nasal spray to help with runny nose.

## 2020-01-11 ENCOUNTER — Other Ambulatory Visit (HOSPITAL_COMMUNITY)
Admission: RE | Admit: 2020-01-11 | Discharge: 2020-01-11 | Disposition: A | Discharge status: Home / Self Care | Payer: No Typology Code available for payment source | Source: Ambulatory Visit | Attending: Physician Assistant | Admitting: Physician Assistant

## 2020-01-11 ENCOUNTER — Other Ambulatory Visit: Payer: Self-pay | Admitting: Physician Assistant

## 2020-01-11 DIAGNOSIS — Z0000 Encounter for general adult medical examination without abnormal findings: Secondary | ICD-10-CM | POA: Insufficient documentation

## 2020-01-14 LAB — CYTOLOGY - PAP
Adequacy: ABSENT
Chlamydia: NEGATIVE
Comment: NEGATIVE
Comment: NORMAL
Diagnosis: NEGATIVE
Neisseria Gonorrhea: NEGATIVE
Trichomonas: NEGATIVE

## 2021-10-13 ENCOUNTER — Encounter (HOSPITAL_BASED_OUTPATIENT_CLINIC_OR_DEPARTMENT_OTHER): Payer: Self-pay

## 2021-10-13 ENCOUNTER — Emergency Department (HOSPITAL_BASED_OUTPATIENT_CLINIC_OR_DEPARTMENT_OTHER)
Admission: EM | Admit: 2021-10-13 | Discharge: 2021-10-13 | Disposition: A | Discharge status: Home / Self Care | Payer: Self-pay | Attending: Emergency Medicine | Admitting: Emergency Medicine

## 2021-10-13 ENCOUNTER — Emergency Department (HOSPITAL_BASED_OUTPATIENT_CLINIC_OR_DEPARTMENT_OTHER): Payer: Self-pay

## 2021-10-13 ENCOUNTER — Other Ambulatory Visit: Payer: Self-pay

## 2021-10-13 DIAGNOSIS — M7711 Lateral epicondylitis, right elbow: Secondary | ICD-10-CM | POA: Insufficient documentation

## 2021-10-13 DIAGNOSIS — Z91010 Allergy to peanuts: Secondary | ICD-10-CM | POA: Insufficient documentation

## 2021-10-13 MED ORDER — ACETAMINOPHEN 500 MG PO TABS
1000.0000 mg | ORAL_TABLET | Freq: Once | ORAL | Status: AC
  Administered 2021-10-13: 1000 mg via ORAL
  Filled 2021-10-13: qty 2

## 2021-10-13 NOTE — ED Provider Notes (Signed)
[place] EMERGENCY DEPARTMENT Provider Note   CSN: [id number] Arrival date & time: 10/13/21  1147     History  Chief Complaint  Patient presents with   Arm Pain    Cynthia Matthews is a 31 y.o. female who presents to the ED today with complaint of gradual onset, constant, sharp, R elbow pain that began 5 days ago. Pt denies any trauma to the elbow. She does lift weights however denies increasing amount of weights recently. She states that she took 2 days off however on Thursday she returned to lifting weights again and after that time began having worsening pain. She took Tylenol yesterday with mild relief of her symptoms. She has also been icing it and applying an ace wrap to the area with mild relief. No other complaints at this time.   The history is provided by the patient and medical records.      Home Medications Prior to Admission medications   Medication Sig Start Date End Date Taking? Authorizing Provider  amoxicillin-clavulanate (AUGMENTIN) 875-125 MG tablet Take 1 tablet by mouth every 12 (twelve) hours. 03/01/17   Larene Pickett, PA-C  lidocaine (XYLOCAINE) 2 % solution Use as directed 15 mLs in the mouth or throat as needed for mouth pain. 06/26/19   Walisiewicz, Verline Lema E, PA-C  mupirocin cream (BACTROBAN) 2 % Apply 1 application topically 2 (two) times daily. 03/01/17   Larene Pickett, PA-C      Allergies    Ibuprofen, Peanuts [peanut oil], and Amoxicillin-pot clavulanate    Review of Systems   Review of Systems  Constitutional:  Negative for chills and fever.  Musculoskeletal:  Positive for arthralgias.  Skin:  Negative for color change.  All other systems reviewed and are negative.  Physical Exam Updated Vital Signs BP 117/74 (BP Location: Left Arm)    Pulse 86    Temp 98.2 F (36.8 C) (Oral)    Resp 18    SpO2 100%  Physical Exam Vitals and nursing note reviewed.  Constitutional:      Appearance: She is not ill-appearing.  HENT:     Head:  Normocephalic and atraumatic.  Eyes:     Conjunctiva/sclera: Conjunctivae normal.  Cardiovascular:     Rate and Rhythm: Normal rate and regular rhythm.  Pulmonary:     Effort: Pulmonary effort is normal.     Breath sounds: Normal breath sounds.  Musculoskeletal:     Comments: Mild swelling noted to R elbow with TTP along the lateral epicondyle. No overlying skin changes including erythema or increased warmth. ROM slight limited with flexion s/2 pain. Able to fully extend elbow without difficulty. Strength and sensation intact throughout. 2+ radial pulse. Cap refill < 2 seconds to all digits on R hand.   Skin:    General: Skin is warm and dry.     Coloration: Skin is not jaundiced.  Neurological:     Mental Status: She is alert.    ED Results / Procedures / Treatments   Labs (all labs ordered are listed, but only abnormal results are displayed) Labs Reviewed - No data to display  EKG None  Radiology DG Elbow Complete Right  Result Date: 10/13/2021 CLINICAL DATA:  Right elbow pain starting on Tuesday EXAM: RIGHT ELBOW - COMPLETE 3+ VIEW COMPARISON:  None. FINDINGS: There is no evidence of fracture, dislocation, or joint effusion. There is no evidence of arthropathy or other focal bone abnormality. Soft tissues are unremarkable. IMPRESSION: Negative. If pain persists despite  conservative therapy, MRI may be warranted for further characterization. Electronically Signed   By: Van Clines M.D.   On: 10/13/2021 13:27    Procedures Procedures    Medications Ordered in ED Medications  acetaminophen (TYLENOL) tablet 1,000 mg (1,000 mg Oral Given 10/13/21 1317)    ED Course/ Medical Decision Making/ A&P                           Medical Decision Making 31 year old female who presents to the ED today with complaint of atraumatic right elbow pain for the past 5 days, worsening in nature after continuing to lift weights this past week.  Arrival to the ED today vitals are stable.   Patient appears to be no acute distress.  She is noted to have some mild swelling and tenderness palpation most pacifically along the lateral epicondyle of the right elbow.  Her range of motion is slightly limited with flexion secondary to pain.  She is otherwise neurovascular intact throughout.  We will plan for Tylenol for pain and x-ray for further evaluation.  With tenderness palpation to the lateral epicondyle I am most suspicious for lateral epicondylitis.  Her exam does not seem consistent with septic arthritis, gout, pseudogout at this time.   Problems Addressed: Lateral epicondylitis of right elbow: acute illness or injury    Details: Xray negative. Will treat symptomatically and provide sports medicine follow up outpatient. Pt instructed to refrain from heavy lifting/weight lifting at this time. STable for discharge.  Amount and/or Complexity of Data Reviewed Radiology: ordered.    Details: Xray negative  Risk OTC drugs.          Final Clinical Impression(s) / ED Diagnoses Final diagnoses:  Lateral epicondylitis of right elbow    Rx / DC Orders ED Discharge Orders     None        Discharge Instructions      Please follow up with sports medicine Dr. Raeford Razor for further evaluation of your elbow pain  It is recommended that you buy a brace OTC to wear for symptomatic relief  While at home please rest, ice, and elevate your arm to help reduce pain/swelling. Continue taking Tylenol as needed for pain  Return to the ED for any new/worsening symptoms        Eustaquio Maize, PA-C 10/13/21 1400    Isla Pence, MD 10/13/21 1415

## 2021-10-13 NOTE — ED Notes (Signed)
Patient transported to X-ray 

## 2021-10-13 NOTE — Discharge Instructions (Addendum)
Please follow up with sports medicine Dr. Jordan Likes for further evaluation of your elbow pain  It is recommended that you buy a brace OTC to wear for symptomatic relief  While at home please rest, ice, and elevate your arm to help reduce pain/swelling. Continue taking Tylenol as needed for pain  Return to the ED for any new/worsening symptoms

## 2021-10-13 NOTE — ED Triage Notes (Signed)
Pt c/o right arm pain, more so in her right elbow, since Tuesday. Pt believes she injured it while working out. Cms intact.

## 2021-12-03 DIAGNOSIS — Z1388 Encounter for screening for disorder due to exposure to contaminants: Secondary | ICD-10-CM | POA: Diagnosis not present

## 2021-12-03 DIAGNOSIS — Z3009 Encounter for other general counseling and advice on contraception: Secondary | ICD-10-CM | POA: Diagnosis not present

## 2021-12-03 DIAGNOSIS — Z0389 Encounter for observation for other suspected diseases and conditions ruled out: Secondary | ICD-10-CM | POA: Diagnosis not present

## 2022-06-27 ENCOUNTER — Other Ambulatory Visit: Payer: Self-pay

## 2022-06-27 ENCOUNTER — Emergency Department (HOSPITAL_BASED_OUTPATIENT_CLINIC_OR_DEPARTMENT_OTHER)
Admission: EM | Admit: 2022-06-27 | Discharge: 2022-06-27 | Disposition: A | Discharge status: Home / Self Care | Payer: Medicaid Other | Attending: Emergency Medicine | Admitting: Emergency Medicine

## 2022-06-27 ENCOUNTER — Encounter (HOSPITAL_BASED_OUTPATIENT_CLINIC_OR_DEPARTMENT_OTHER): Payer: Self-pay

## 2022-06-27 DIAGNOSIS — Z91010 Allergy to peanuts: Secondary | ICD-10-CM | POA: Insufficient documentation

## 2022-06-27 DIAGNOSIS — R21 Rash and other nonspecific skin eruption: Secondary | ICD-10-CM | POA: Insufficient documentation

## 2022-06-27 MED ORDER — CEPHALEXIN 500 MG PO CAPS: 500.0000 mg | ORAL_CAPSULE | Freq: Four times a day (QID) | ORAL | 0 refills | Status: AC

## 2022-06-27 NOTE — Discharge Instructions (Signed)
I have refilled your Keflex prescription that has been sent to your pharmacy. Please return if you have any further concerning symptoms.

## 2022-06-27 NOTE — ED Triage Notes (Signed)
Pt has rash on face history of that she sees dermatologist for.

## 2022-06-27 NOTE — ED Provider Notes (Signed)
[place] EMERGENCY DEPARTMENT Provider Note   CSN: [id number] Arrival date & time: 06/27/22  5400     History PMH: Impetigo Chief Complaint  Patient presents with   Rash    Cynthia Matthews is a 31 y.o. female. Patients with a rash that is around her lips today and feels irritated.  She says she has a history of impetigo that flares up every now and then and this feels identical to that.  She states she already started taking her Bactroban ointment yesterday.  She is not able to get into her dermatologist until next week so she is requesting Keflex as this is what she has responded to well in the past.  She has no fevers or chills, rash on any other part of her body, or other concerning symptoms.   Rash      Home Medications Prior to Admission medications   Medication Sig Start Date End Date Taking? Authorizing Provider  cephALEXin (KEFLEX) 500 MG capsule Take 1 capsule (500 mg total) by mouth 4 (four) times daily for 10 days. 06/27/22 07/07/22 Yes Izaya Netherton, Adora Fridge, PA-C  amoxicillin-clavulanate (AUGMENTIN) 875-125 MG tablet Take 1 tablet by mouth every 12 (twelve) hours. 03/01/17   Larene Pickett, PA-C  lidocaine (XYLOCAINE) 2 % solution Use as directed 15 mLs in the mouth or throat as needed for mouth pain. 06/26/19   Walisiewicz, Verline Lema E, PA-C  mupirocin cream (BACTROBAN) 2 % Apply 1 application topically 2 (two) times daily. 03/01/17   Larene Pickett, PA-C      Allergies    Ibuprofen, Peanuts [peanut oil], and Amoxicillin-pot clavulanate    Review of Systems   Review of Systems  Skin:  Positive for rash.  All other systems reviewed and are negative.   Physical Exam Updated Vital Signs BP 122/80 (BP Location: Left Arm)   Pulse 82   Temp 97.8 F (36.6 C) (Oral)   Resp 18   Ht 5\' 1"  (1.549 m)   Wt 67.6 kg   SpO2 100%   BMI 28.15 kg/m  Physical Exam Vitals and nursing note reviewed.  Constitutional:      General: She is not in acute distress.     Appearance: Normal appearance. She is well-developed. She is not ill-appearing, toxic-appearing or diaphoretic.  HENT:     Head: Normocephalic and atraumatic.     Nose: No nasal deformity.     Mouth/Throat:     Lips: Pink. No lesions.     Comments: Honey crusted rash of upper lip Eyes:     General: Gaze aligned appropriately. No scleral icterus.       Right eye: No discharge.        Left eye: No discharge.     Conjunctiva/sclera: Conjunctivae normal.     Right eye: Right conjunctiva is not injected. No exudate or hemorrhage.    Left eye: Left conjunctiva is not injected. No exudate or hemorrhage. Pulmonary:     Effort: Pulmonary effort is normal. No respiratory distress.  Skin:    General: Skin is warm and dry.  Neurological:     Mental Status: She is alert and oriented to person, place, and time.  Psychiatric:        Mood and Affect: Mood normal.        Speech: Speech normal.        Behavior: Behavior normal. Behavior is cooperative.     ED Results / Procedures / Treatments   Labs (all labs ordered  are listed, but only abnormal results are displayed) Labs Reviewed - No data to display  EKG None  Radiology No results found.  Procedures Procedures   Medications Ordered in ED Medications - No data to display  ED Course/ Medical Decision Making/ A&P                           Medical Decision Making Risk Prescription drug management.   She is here with a peri oral rash.  She has history of reoccurring impetigo.  She has responded to Keflex well in the past and is already on her Bactroban therapy.  I have represcribed Keflex as I don't suspect any other alternative cause to her symptoms today.  She should follow-up with her dermatologist.  Final Clinical Impression(s) / ED Diagnoses Final diagnoses:  Rash    Rx / DC Orders ED Discharge Orders          Ordered    cephALEXin (KEFLEX) 500 MG capsule  4 times daily        06/27/22 0928               Claudie Leach, PA-C 06/27/22 0932    Loetta Rough, MD 06/27/22 [phone]

## 2022-09-28 ENCOUNTER — Telehealth: Payer: Self-pay

## 2022-09-28 NOTE — Telephone Encounter (Signed)
Mychart msg sent. AS, CMA 

## 2022-10-04 DIAGNOSIS — Z113 Encounter for screening for infections with a predominantly sexual mode of transmission: Secondary | ICD-10-CM | POA: Diagnosis not present

## 2022-10-04 DIAGNOSIS — N898 Other specified noninflammatory disorders of vagina: Secondary | ICD-10-CM | POA: Diagnosis not present

## 2022-11-12 IMAGING — DX DG ELBOW COMPLETE 3+V*R*
4 series · 4 of 4 positions shown · non-contrast
Comparison: None.

CLINICAL DATA: Right elbow pain starting on [REDACTED]

EXAM:
RIGHT ELBOW - COMPLETE 3+ VIEW

[elbow ap]
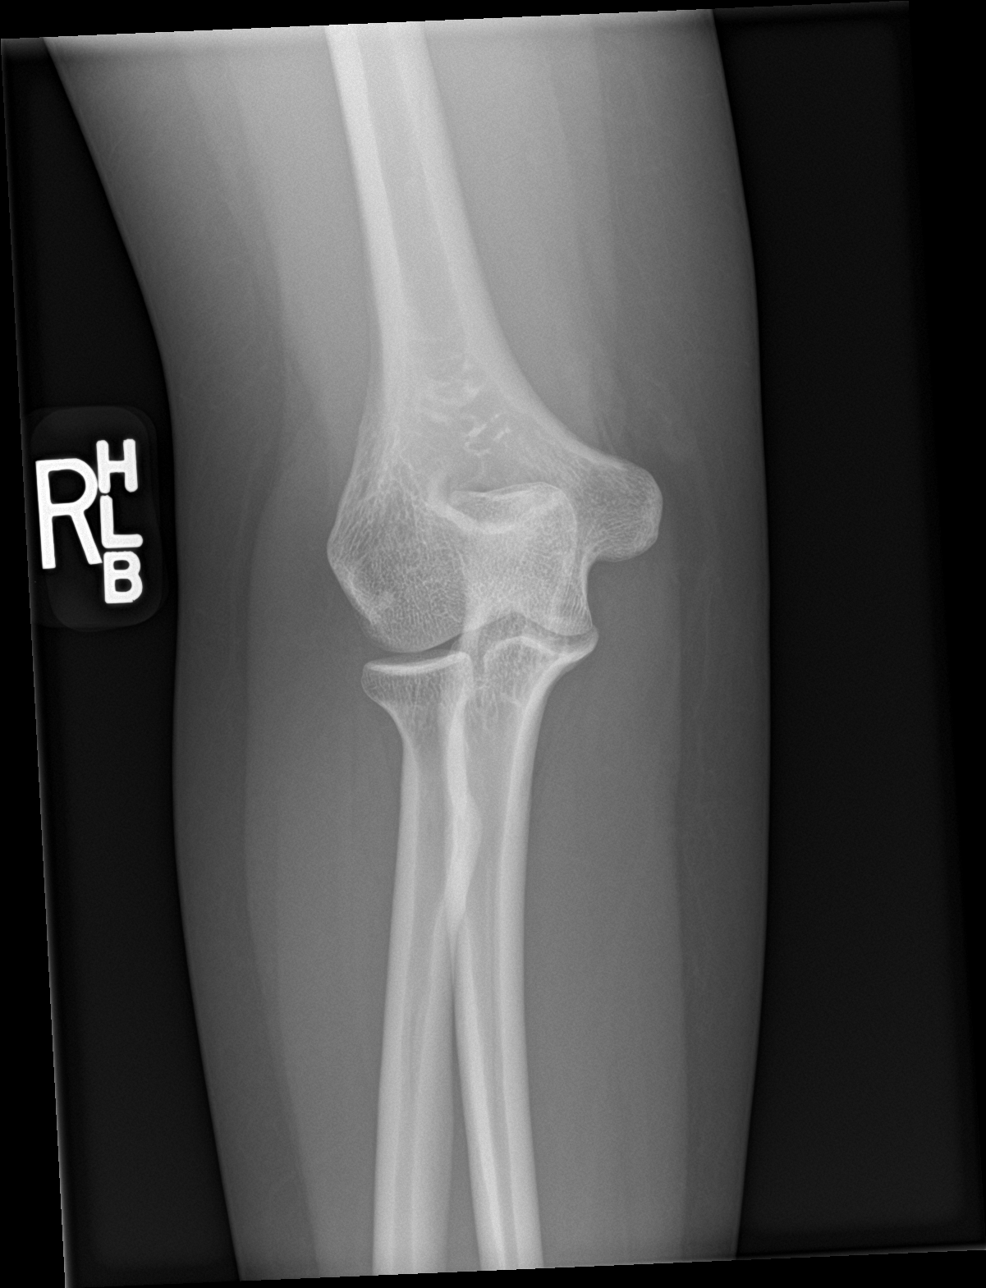

[elbow obl (1 of 2)]
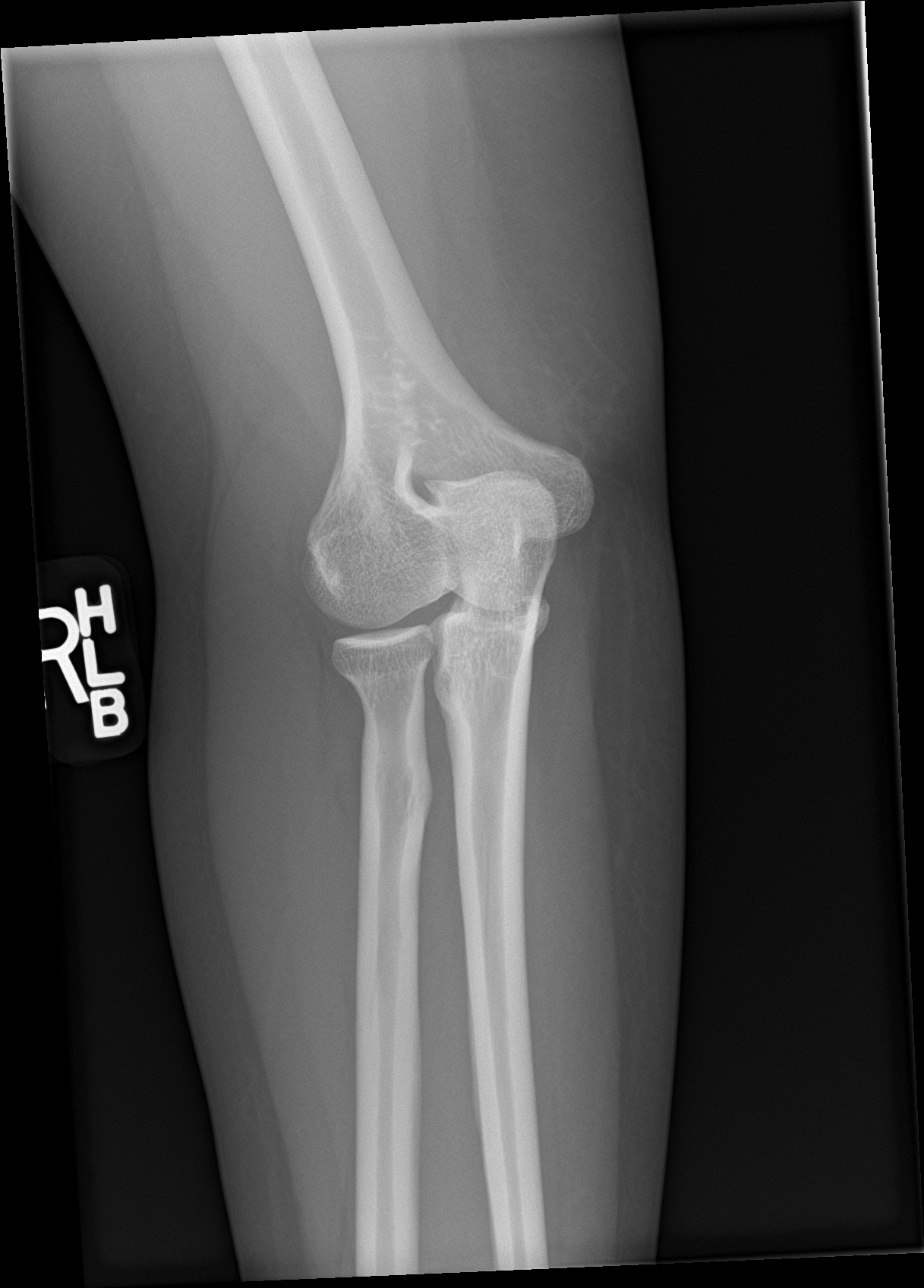

[elbow obl (2 of 2)]
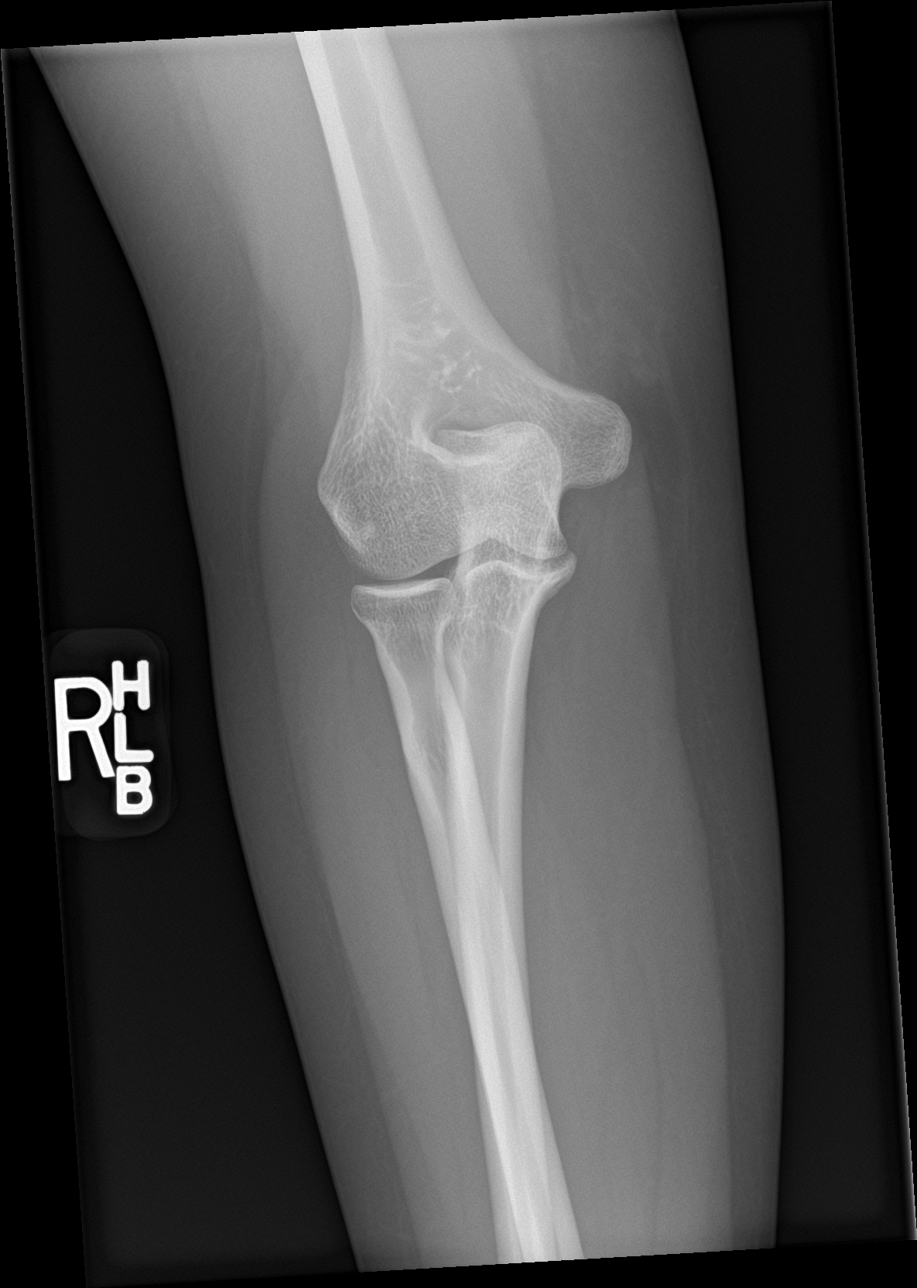

[elbow lat]
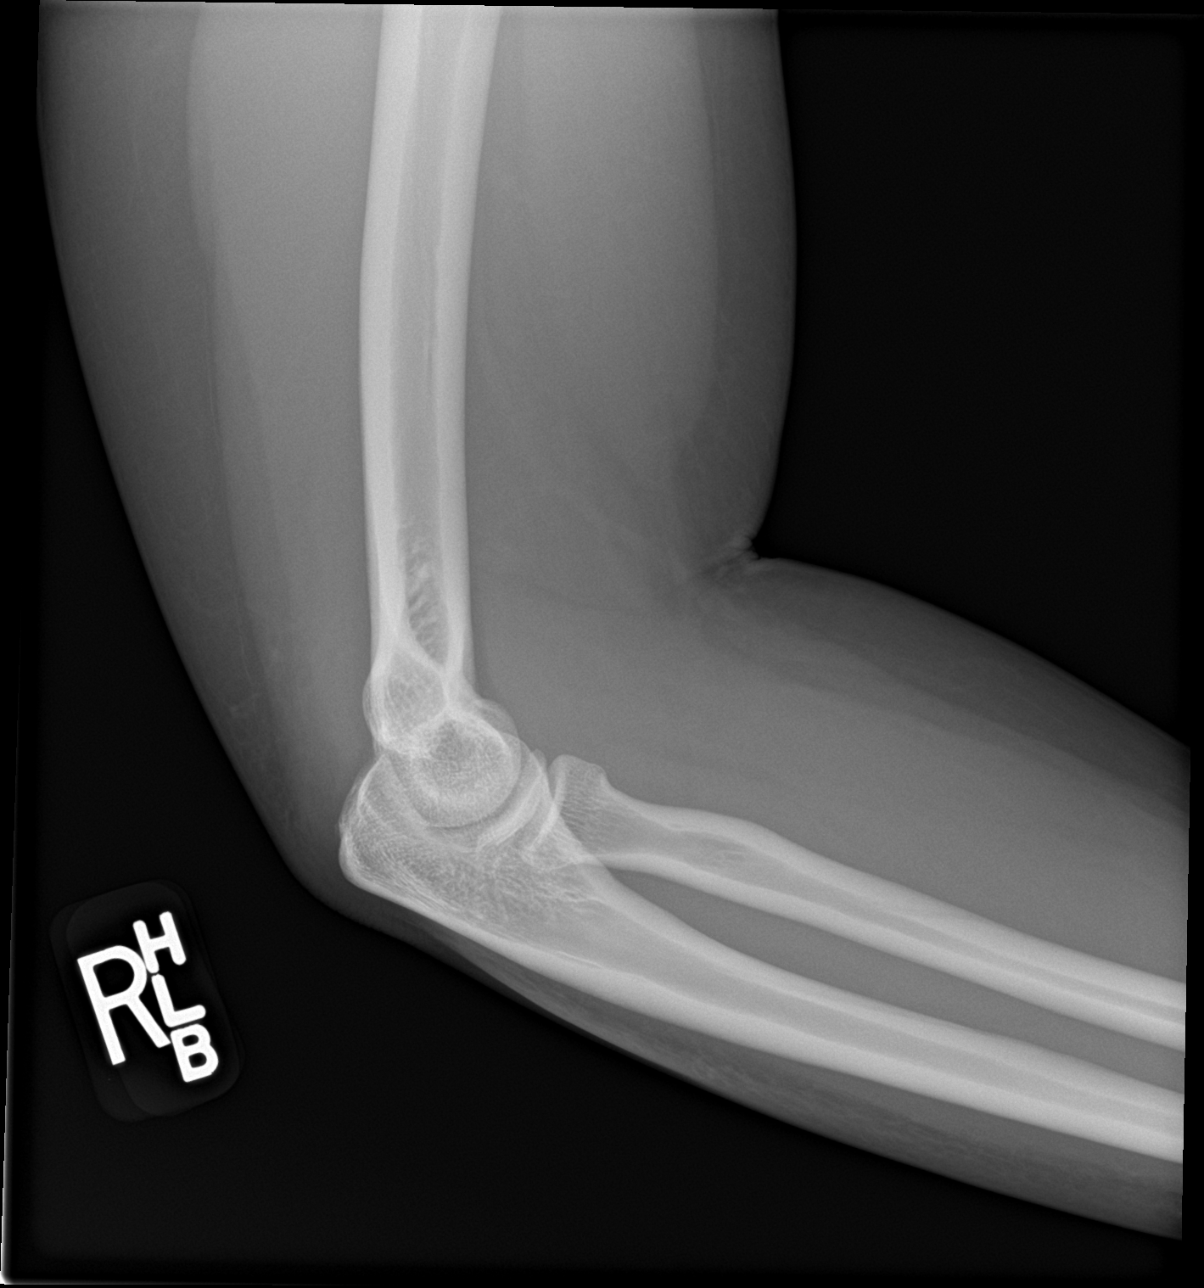

[4 of 4 positions shown; findings below may reference images not displayed]

FINDINGS: There is no evidence of fracture, dislocation, or joint effusion.
There is no evidence of arthropathy or other focal bone abnormality.
Soft tissues are unremarkable.
IMPRESSION: Negative. If pain persists despite conservative therapy, MRI may be
warranted for further characterization.

## 2022-12-05 DIAGNOSIS — Z1329 Encounter for screening for other suspected endocrine disorder: Secondary | ICD-10-CM | POA: Diagnosis not present

## 2022-12-05 DIAGNOSIS — Z0001 Encounter for general adult medical examination with abnormal findings: Secondary | ICD-10-CM | POA: Diagnosis not present

## 2022-12-05 DIAGNOSIS — E663 Overweight: Secondary | ICD-10-CM | POA: Diagnosis not present

## 2022-12-05 DIAGNOSIS — Z131 Encounter for screening for diabetes mellitus: Secondary | ICD-10-CM | POA: Diagnosis not present

## 2022-12-05 DIAGNOSIS — B3731 Acute candidiasis of vulva and vagina: Secondary | ICD-10-CM | POA: Diagnosis not present

## 2022-12-05 DIAGNOSIS — N898 Other specified noninflammatory disorders of vagina: Secondary | ICD-10-CM | POA: Diagnosis not present

## 2022-12-05 DIAGNOSIS — Z713 Dietary counseling and surveillance: Secondary | ICD-10-CM | POA: Diagnosis not present

## 2022-12-05 DIAGNOSIS — Z13220 Encounter for screening for lipoid disorders: Secondary | ICD-10-CM | POA: Diagnosis not present

## 2022-12-05 DIAGNOSIS — Z124 Encounter for screening for malignant neoplasm of cervix: Secondary | ICD-10-CM | POA: Diagnosis not present

## 2022-12-05 DIAGNOSIS — Z0189 Encounter for other specified special examinations: Secondary | ICD-10-CM | POA: Diagnosis not present

## 2022-12-05 DIAGNOSIS — Z113 Encounter for screening for infections with a predominantly sexual mode of transmission: Secondary | ICD-10-CM | POA: Diagnosis not present

## 2022-12-05 DIAGNOSIS — Z7182 Exercise counseling: Secondary | ICD-10-CM | POA: Diagnosis not present

## 2022-12-05 DIAGNOSIS — N760 Acute vaginitis: Secondary | ICD-10-CM | POA: Diagnosis not present

## 2023-01-08 DIAGNOSIS — N898 Other specified noninflammatory disorders of vagina: Secondary | ICD-10-CM | POA: Diagnosis not present

## 2023-01-08 DIAGNOSIS — Z114 Encounter for screening for human immunodeficiency virus [HIV]: Secondary | ICD-10-CM | POA: Diagnosis not present

## 2023-01-08 DIAGNOSIS — Z113 Encounter for screening for infections with a predominantly sexual mode of transmission: Secondary | ICD-10-CM | POA: Diagnosis not present

## 2023-02-04 DIAGNOSIS — B3731 Acute candidiasis of vulva and vagina: Secondary | ICD-10-CM | POA: Diagnosis not present

## 2023-02-04 DIAGNOSIS — N898 Other specified noninflammatory disorders of vagina: Secondary | ICD-10-CM | POA: Diagnosis not present

## 2023-02-04 DIAGNOSIS — Z113 Encounter for screening for infections with a predominantly sexual mode of transmission: Secondary | ICD-10-CM | POA: Diagnosis not present

## 2023-02-27 DIAGNOSIS — Z113 Encounter for screening for infections with a predominantly sexual mode of transmission: Secondary | ICD-10-CM | POA: Diagnosis not present

## 2023-02-27 DIAGNOSIS — Z114 Encounter for screening for human immunodeficiency virus [HIV]: Secondary | ICD-10-CM | POA: Diagnosis not present

## 2023-04-17 ENCOUNTER — Emergency Department (HOSPITAL_BASED_OUTPATIENT_CLINIC_OR_DEPARTMENT_OTHER): Payer: Medicaid Other

## 2023-04-17 ENCOUNTER — Encounter (HOSPITAL_BASED_OUTPATIENT_CLINIC_OR_DEPARTMENT_OTHER): Payer: Self-pay | Admitting: Emergency Medicine

## 2023-04-17 ENCOUNTER — Other Ambulatory Visit: Payer: Self-pay

## 2023-04-17 ENCOUNTER — Other Ambulatory Visit (HOSPITAL_BASED_OUTPATIENT_CLINIC_OR_DEPARTMENT_OTHER): Payer: Self-pay

## 2023-04-17 ENCOUNTER — Emergency Department (HOSPITAL_BASED_OUTPATIENT_CLINIC_OR_DEPARTMENT_OTHER)
Admission: EM | Admit: 2023-04-17 | Discharge: 2023-04-17 | Disposition: A | Discharge status: Home / Self Care | Payer: Medicaid Other | Attending: Emergency Medicine | Admitting: Emergency Medicine

## 2023-04-17 DIAGNOSIS — R0781 Pleurodynia: Secondary | ICD-10-CM

## 2023-04-17 DIAGNOSIS — R071 Chest pain on breathing: Secondary | ICD-10-CM | POA: Insufficient documentation

## 2023-04-17 DIAGNOSIS — Z91010 Allergy to peanuts: Secondary | ICD-10-CM | POA: Diagnosis not present

## 2023-04-17 DIAGNOSIS — R109 Unspecified abdominal pain: Secondary | ICD-10-CM | POA: Diagnosis not present

## 2023-04-17 LAB — CBC WITH DIFFERENTIAL/PLATELET
Abs Immature Granulocytes: 0.01 10*3/uL (ref 0.00–0.07)
Basophils Absolute: 0 10*3/uL (ref 0.0–0.1)
Basophils Relative: 1 %
Eosinophils Absolute: 0.1 10*3/uL (ref 0.0–0.5)
Eosinophils Relative: 1 %
HCT: 39.2 % (ref 36.0–46.0)
Hemoglobin: 13 g/dL (ref 12.0–15.0)
Immature Granulocytes: 0 %
Lymphocytes Relative: 35 %
Lymphs Abs: 1.5 10*3/uL (ref 0.7–4.0)
MCH: 30.1 pg (ref 26.0–34.0)
MCHC: 33.2 g/dL (ref 30.0–36.0)
MCV: 90.7 fL (ref 80.0–100.0)
Monocytes Absolute: 0.4 10*3/uL (ref 0.1–1.0)
Monocytes Relative: 10 %
Neutro Abs: 2.3 10*3/uL (ref 1.7–7.7)
Neutrophils Relative %: 53 %
Platelets: 175 10*3/uL (ref 150–400)
RBC: 4.32 MIL/uL (ref 3.87–5.11)
RDW: 12.7 % (ref 11.5–15.5)
WBC: 4.4 10*3/uL (ref 4.0–10.5)
nRBC: 0 % (ref 0.0–0.2)

## 2023-04-17 LAB — D-DIMER, QUANTITATIVE: D-Dimer, Quant: <0.27 ug/mL-FEU (ref 0.00–0.50)

## 2023-04-17 LAB — PREGNANCY, URINE: Preg Test, Ur: NEGATIVE

## 2023-04-17 LAB — TROPONIN I (HIGH SENSITIVITY): Troponin I (High Sensitivity): <2 ng/L (ref <18)

## 2023-04-17 MED ORDER — CYCLOBENZAPRINE HCL 10 MG PO TABS
10.0000 mg | ORAL_TABLET | Freq: Two times a day (BID) | ORAL | 0 refills | Status: AC | PRN
  Filled 2023-04-17: qty 14, 7d supply, fill #0

## 2023-04-17 MED ORDER — LIDOCAINE 5 % EX PTCH
1.0000 | MEDICATED_PATCH | CUTANEOUS | 0 refills | Status: AC
  Filled 2023-04-17: qty 30, 30d supply, fill #0

## 2023-04-17 NOTE — ED Triage Notes (Signed)
Patient c/o left sided rib cage/ back pain. Patient states it felt sore Sunday and tried stretching. Monday became more painful. States it has traveled and now hurts to laugh.

## 2023-04-17 NOTE — ED Provider Notes (Signed)
[place] EMERGENCY DEPARTMENT AT [facility] Provider Note   CSN: [id number] Arrival date & time: 04/17/23  7829     History  Chief Complaint  Patient presents with   Back Pain    Cynthia Matthews is a 32 y.o. female.  HPI     32yo-year-old female with no significant medical history presents with concern for left-sided chest and back pain.  Reports that it started on Sunday, as a sharp pleuritic pain.  Initially she thought it was a soreness after working out, but then felt different.  Reports severe sharp pain when she coughs, laughs, takes a deep breath.  She feels shortness of breath in that it is difficult to take a deep breath.  Denies nausea, vomiting, abdominal pain, fever, cough.  She has no history of PE or DVT, family history of PE or DVT, recent travel, immobilization, OCP use.  The pain is also somewhat positional.  It is not worse with exertion.  She has been trying to use ibuprofen without relief.  Past Medical History:  Diagnosis Date   Sinus disorder      Home Medications Prior to Admission medications   Medication Sig Start Date End Date Taking? Authorizing Provider  cyclobenzaprine (FLEXERIL) 10 MG tablet Take 1 tablet (10 mg total) by mouth 2 (two) times daily as needed for up to 7 days for muscle spasms. 04/17/23 04/24/23 Yes Pal[place]ra Rogacki, Denny Peon, MD  lidocaine (LIDODERM) 5 % Place 1 patch onto the skin daily. Remove & Discard patch within 12 hours or as directed by MD 04/17/23  Yes Alvira Monday, MD  amoxicillin-clavulanate (AUGMENTIN) 875-125 MG tablet Take 1 tablet by mouth every 12 (twelve) hours. 03/01/17   Garlon Hatchet, PA-C  lidocaine (XYLOCAINE) 2 % solution Use as directed 15 mLs in the mouth or throat as needed for mouth pain. 06/26/19   Walisiewicz, Yvonna Alanis E, PA-C  mupirocin cream (BACTROBAN) 2 % Apply 1 application topically 2 (two) times daily. 03/01/17   Garlon Hatchet, PA-C      Allergies    Ibuprofen, Peanuts [peanut oil], and  Amoxicillin-pot clavulanate    Review of Systems   Review of Systems  Physical Exam Updated Vital Signs BP 129/76 (BP Location: Left Arm)   Pulse 71   Temp 97.9 F (36.6 C) (Oral)   Resp 16   Ht 5\' 1"  (1.549 m)   Wt 63.5 kg   LMP 04/02/2023   SpO2 100%   BMI 26.45 kg/m  Physical Exam Vitals and nursing note reviewed.  Constitutional:      General: She is not in acute distress.    Appearance: She is well-developed. She is not diaphoretic.  HENT:     Head: Normocephalic and atraumatic.  Eyes:     Conjunctiva/sclera: Conjunctivae normal.  Cardiovascular:     Rate and Rhythm: Normal rate and regular rhythm.     Pulses: Normal pulses.     Heart sounds: Normal heart sounds. No murmur heard.    No friction rub. No gallop.  Pulmonary:     Effort: Pulmonary effort is normal. No respiratory distress.     Breath sounds: Normal breath sounds. No wheezing or rales.  Abdominal:     General: There is no distension.     Palpations: Abdomen is soft.     Tenderness: There is no abdominal tenderness. There is no guarding.  Musculoskeletal:        General: No tenderness.     Cervical back: Normal range  of motion.  Skin:    General: Skin is warm and dry.     Findings: No erythema or rash.  Neurological:     Mental Status: She is alert and oriented to person, place, and time.     ED Results / Procedures / Treatments   Labs (all labs ordered are listed, but only abnormal results are displayed) Labs Reviewed  CBC WITH DIFFERENTIAL/PLATELET  D-DIMER, QUANTITATIVE  PREGNANCY, URINE  TROPONIN I (HIGH SENSITIVITY)  TROPONIN I (HIGH SENSITIVITY)    EKG EKG Interpretation Date/Time:  Thursday April 17 2023 10:07:33 EDT Ventricular Rate:  82 PR Interval:  144 QRS Duration:  77 QT Interval:  371 QTC Calculation: 434 R Axis:   73  Text Interpretation: Sinus rhythm Consider right atrial enlargement No previous ECGs available Confirmed by Alvira Monday ([id number]) on 04/17/2023  12:07:25 PM  Radiology DG Chest 2 View  Result Date: 04/17/2023 CLINICAL DATA:  Right-sided flank pain EXAM: CHEST - 2 VIEW COMPARISON:  Abdomen and chest radiographs August 27, 2006 FINDINGS: The heart size and mediastinal contours are within normal limits. No focal pulmonary opacity. No pleural effusion or pneumothorax. The visualized upper abdomen is unremarkable. No acute osseous abnormality. IMPRESSION: No acute cardiopulmonary abnormality. Electronically Signed   By: Jacob Moores M.D.   On: 04/17/2023 10:54    Procedures Procedures    Medications Ordered in ED Medications - No data to display  ED Course/ Medical Decision Making/ A&P                               32yo-year-old female with no significant medical history presents with concern for left-sided chest and back pain.   Differential diagnosis for chest pain includes pulmonary embolus, dissection, pneumothorax, pneumonia, ACS, myocarditis, pericarditis.    EKG was done and evaluate by me and showed no significant acute ST changes and no signs of pericarditis.   Chest x-ray was done and evaluated by me and radiology and showed no sign of pneumonia or pneumothorax.  Normal pulses bilaterally, have low clinical suspicion for aortic dissection by history, exam and x-ray.  Labs completed and personally about interpreted by me show no anemia, no leukocytosis.  The lab was undergoing maintenance on the machine to run chemistries and it appears the comprehensive metabolic panel was not completed--given pain is located more superiorly than flank, not having other reasons to suspect significant electrolyte abnormalities (no vomiting, no DM hx) do not feel she needs emergent return to the ED for this lab work.  She is low risk Wells and had a ddimer which was negative.  Troponin negative greater than 6 hours after presentation and doubt ACS.    Given pain worse with cough, deep breath, movement, some pain with palpation suspect  likely muscular strain.  Given rx for muscle relaxant. Discussed reasons to return. Patient discharged in stable condition with understanding of reasons to return.         Final Clinical Impression(s) / ED Diagnoses Final diagnoses:  Pleuritic chest pain    Rx / DC Orders ED Discharge Orders          Ordered    cyclobenzaprine (FLEXERIL) 10 MG tablet  2 times daily PRN        04/17/23 1216    lidocaine (LIDODERM) 5 %  Every 24 hours        04/17/23 1216  Alvira Monday, MD 04/19/23 [phone]

## 2023-04-23 DIAGNOSIS — Z113 Encounter for screening for infections with a predominantly sexual mode of transmission: Secondary | ICD-10-CM | POA: Diagnosis not present

## 2023-06-26 DIAGNOSIS — N6322 Unspecified lump in the left breast, upper inner quadrant: Secondary | ICD-10-CM | POA: Diagnosis not present

## 2023-07-02 DIAGNOSIS — F419 Anxiety disorder, unspecified: Secondary | ICD-10-CM | POA: Diagnosis not present

## 2023-07-03 DIAGNOSIS — F4320 Adjustment disorder, unspecified: Secondary | ICD-10-CM | POA: Diagnosis not present

## 2023-07-03 DIAGNOSIS — N644 Mastodynia: Secondary | ICD-10-CM | POA: Diagnosis not present

## 2023-07-03 DIAGNOSIS — R92333 Mammographic heterogeneous density, bilateral breasts: Secondary | ICD-10-CM | POA: Diagnosis not present

## 2023-07-03 DIAGNOSIS — N6320 Unspecified lump in the left breast, unspecified quadrant: Secondary | ICD-10-CM | POA: Diagnosis not present

## 2023-07-03 DIAGNOSIS — N6322 Unspecified lump in the left breast, upper inner quadrant: Secondary | ICD-10-CM | POA: Diagnosis not present

## 2023-07-12 DIAGNOSIS — F4320 Adjustment disorder, unspecified: Secondary | ICD-10-CM | POA: Diagnosis not present

## 2023-07-21 DIAGNOSIS — M791 Myalgia, unspecified site: Secondary | ICD-10-CM | POA: Diagnosis not present

## 2023-07-24 DIAGNOSIS — F4320 Adjustment disorder, unspecified: Secondary | ICD-10-CM | POA: Diagnosis not present

## 2023-08-07 DIAGNOSIS — F411 Generalized anxiety disorder: Secondary | ICD-10-CM | POA: Diagnosis not present

## 2023-08-07 DIAGNOSIS — F4310 Post-traumatic stress disorder, unspecified: Secondary | ICD-10-CM | POA: Diagnosis not present

## 2023-08-07 DIAGNOSIS — F331 Major depressive disorder, recurrent, moderate: Secondary | ICD-10-CM | POA: Diagnosis not present

## 2023-08-14 DIAGNOSIS — F411 Generalized anxiety disorder: Secondary | ICD-10-CM | POA: Diagnosis not present

## 2023-08-14 DIAGNOSIS — F4310 Post-traumatic stress disorder, unspecified: Secondary | ICD-10-CM | POA: Diagnosis not present

## 2023-08-28 DIAGNOSIS — F4320 Adjustment disorder, unspecified: Secondary | ICD-10-CM | POA: Diagnosis not present

## 2023-09-04 DIAGNOSIS — M25512 Pain in left shoulder: Secondary | ICD-10-CM | POA: Diagnosis not present

## 2023-09-05 ENCOUNTER — Other Ambulatory Visit: Payer: Self-pay

## 2023-09-05 ENCOUNTER — Emergency Department (HOSPITAL_BASED_OUTPATIENT_CLINIC_OR_DEPARTMENT_OTHER)
Admission: EM | Admit: 2023-09-05 | Discharge: 2023-09-05 | Disposition: A | Discharge status: Home / Self Care | Payer: Medicaid Other | Attending: Emergency Medicine | Admitting: Emergency Medicine

## 2023-09-05 ENCOUNTER — Emergency Department (HOSPITAL_BASED_OUTPATIENT_CLINIC_OR_DEPARTMENT_OTHER): Payer: Medicaid Other

## 2023-09-05 ENCOUNTER — Encounter (HOSPITAL_BASED_OUTPATIENT_CLINIC_OR_DEPARTMENT_OTHER): Payer: Self-pay

## 2023-09-05 DIAGNOSIS — M25512 Pain in left shoulder: Secondary | ICD-10-CM | POA: Insufficient documentation

## 2023-09-05 DIAGNOSIS — Z91010 Allergy to peanuts: Secondary | ICD-10-CM | POA: Insufficient documentation

## 2023-09-05 MED ORDER — TRIAMCINOLONE ACETONIDE 40 MG/ML IJ SUSP
40.0000 mg | Freq: Once | INTRAMUSCULAR | Status: AC
  Administered 2023-09-05: 40 mg via INTRAMUSCULAR
  Filled 2023-09-05: qty 5

## 2023-09-05 MED ORDER — PREDNISONE 20 MG PO TABS: 20.0000 mg | ORAL_TABLET | Freq: Every day | ORAL | 0 refills | Status: AC

## 2023-09-05 NOTE — ED Notes (Signed)
 Discharge paperwork reviewed entirely with patient, including follow up care. Pain was under control. The patient received instruction and coaching on their prescriptions, and all follow-up questions were answered.  Pt verbalized understanding as well as all parties involved. No questions or concerns voiced at the time of discharge. No acute distress noted.   Pt ambulated out to PVA without incident or assistance.  Pt advised they will notify their PCP immediately. and Pt advised they will seek followup care with a specialist and followup with their PCP.

## 2023-09-05 NOTE — Discharge Instructions (Signed)
Prednisone

## 2023-09-05 NOTE — ED Triage Notes (Signed)
The patient having left shoulder pain that moves into her neck. No injury.

## 2023-09-05 NOTE — ED Provider Notes (Signed)
[place] [facility] Provider Note   CSN: [id number] Arrival date & time: 09/05/23  1143     History  Chief Complaint  Patient presents with   Shoulder Pain    Cynthia Matthews is a 32 y.o. female.   Shoulder Pain   32 year old female presents emergency department with complaints of left shoulder pain.  Patient reports shoulder pain that began earlier this week.  States she initially took Tylenol for symptoms which helped but subsequently, pain is worsened prompting visit to the emergency department.  Did see primary care yesterday who obtained x-ray imaging but x-rays had not resulted yet.  Patient reports continued pain.  Reports pain worsened with movement and relieved with rest.  Now she feels pain with any movement at all with her shoulder.  Denies any weakness or sensory deficits on the left hand.  Denies any fever, chills, chest pain, shortness of breath, exertional worsening of symptoms.  States the pain initially began when she was at home.  Denies any known trauma/injury to the left shoulder.  Past medical history significant for sinus disorder  Home Medications Prior to Admission medications   Medication Sig Start Date End Date Taking? Authorizing Provider  predniSONE (DELTASONE) 20 MG tablet Take 1 tablet (20 mg total) by mouth daily with breakfast for 4 days. 09/07/23 09/11/23 Yes Sherian Maroon A, PA  amoxicillin-clavulanate (AUGMENTIN) 875-125 MG tablet Take 1 tablet by mouth every 12 (twelve) hours. 03/01/17   Garlon Hatchet, PA-C  lidocaine (LIDODERM) 5 % Place 1 patch onto the skin daily. Remove & Discard patch within 12 hours or as directed by MD 04/17/23   Alvira Monday, MD  lidocaine (XYLOCAINE) 2 % solution Use as directed 15 mLs in the mouth or throat as needed for mouth pain. 06/26/19   Walisiewicz, Yvonna Alanis E, PA-C  mupirocin cream (BACTROBAN) 2 % Apply 1 application topically 2 (two) times daily. 03/01/17   Garlon Hatchet, PA-C      Allergies    Ibuprofen, Peanuts [peanut oil], and Amoxicillin-pot clavulanate    Review of Systems   Review of Systems  All other systems reviewed and are negative.   Physical Exam Updated Vital Signs BP 111/77   Pulse 91   Temp 99.2 F (37.3 C) (Oral)   Resp 16   Ht 5\' 1"  (1.549 m)   Wt 64 kg   LMP 08/30/2023   SpO2 95%   BMI 26.66 kg/m  Physical Exam Vitals and nursing note reviewed.  Constitutional:      General: She is not in acute distress.    Appearance: She is well-developed.  HENT:     Head: Normocephalic and atraumatic.  Eyes:     Conjunctiva/sclera: Conjunctivae normal.  Cardiovascular:     Rate and Rhythm: Normal rate and regular rhythm.     Heart sounds: No murmur heard. Pulmonary:     Effort: Pulmonary effort is normal. No respiratory distress.     Breath sounds: Normal breath sounds. No wheezing, rhonchi or rales.  Abdominal:     Palpations: Abdomen is soft.     Tenderness: There is no abdominal tenderness.  Musculoskeletal:        General: No swelling.     Cervical back: Neck supple.     Comments: Tender palpation left-sided trapezial ridge, left deltoid.  Patient guarding left shoulder with pain with any active range of motion or with passive range of motion.  Full range of motion of  left elbow, wrist, digits.  Radial pulses 2+ bilaterally.  No sensory deficits along major nerve distributions of upper extremities.  No overlying erythema, palpable fluctuance/induration.  Skin:    General: Skin is warm and dry.     Capillary Refill: Capillary refill takes less than 2 seconds.  Neurological:     Mental Status: She is alert.  Psychiatric:        Mood and Affect: Mood normal.     ED Results / Procedures / Treatments   Labs (all labs ordered are listed, but only abnormal results are displayed) Labs Reviewed - No data to display  EKG None  Radiology DG Shoulder Left Result Date: 09/05/2023 CLINICAL DATA:  Left shoulder pain  without known injury. EXAM: LEFT SHOULDER - 2+ VIEW COMPARISON:  September 04, 2023. FINDINGS: There is no evidence of fracture or dislocation. There is no evidence of arthropathy. Grossly shaped calcific densities are seen projected lateral to greater tuberosity of proximal humerus suggesting calcific tendinosis. Soft tissues are unremarkable. 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  No acute abnormality seen. Electronically Signed   By: Lupita Raider M.D.   On: 09/05/2023 13:44    Procedures Procedures    Medications Ordered in ED Medications  triamcinolone acetonide (KENALOG-40) injection 40 mg (40 mg Intramuscular Given 09/05/23 1252)    ED Course/ Medical Decision Making/ A&P                                 Medical Decision Making Amount and/or Complexity of Data Reviewed Radiology: ordered.   This patient presents to the ED for concern of shoulder pain, this involves an extensive number of treatment options, and is a complaint that carries with it a high risk of complications and morbidity.  The differential diagnosis includes fracture, strain/pain, dislocation, ligamentous/tendinous injury, neurovascular compromise, ACS, PE, other   Co morbidities that complicate the patient evaluation  See HPI   Additional history obtained:  Additional history obtained from EMR External records from outside source obtained and reviewed including hospital records   Lab Tests:  N/a   Imaging Studies ordered:  I ordered imaging studies including left shoulder x-ray I independently visualized and interpreted imaging which showed no acute osseous abnormality.  Calcific tendinitis I agree with the radiologist interpretation   Cardiac Monitoring: / EKG:  The patient was maintained on a cardiac monitor.  I personally viewed and interpreted the cardiac monitored which showed an underlying rhythm of: Sinus rhythm   Consultations Obtained:  N/a   Problem List / ED Course /  Critical interventions / Medication management  Left shoulder pain I ordered medication including Kenalog   Reevaluation of the patient after these medicines showed that the patient improved I have reviewed the patients home medicines and have made adjustments as needed   Social Determinants of Health:  Denies tobacco, cigarette use   Test / Admission - Considered:  Left shoulder pain Vitals signs within normal range and stable throughout visit. Imaging studies significant for: See above 32 year old female presents emergency department complaints of left-sided shoulder pain since earlier this week.  On exam, limited range of motion of left shoulder actively as well as passively.  No overlying skin changes concerning for secondary infectious process.  No upper extremity swelling concerning for DVT.  No pulse deficits concerning for ischemic limb.  X-ray obtained which was negative for fracture/dislocation but with calcific tendinitis.  Suspect the patient could have  had rotator cuff injury and now has developed frozen shoulder.  Already has PT established through primary care.  Recommend rest/regular range of motion exercises, ice, Tylenol for pain.  Given patient's anaphylactic allergy to NSAIDs, will trial short course of prednisone.  Will recommend subsequent follow-up with orthopedics for reevaluation.  Treatment plan discussed at length with patient and she acknowledged understanding was agreeable to said plan.  Patient overall well-appearing, afebrile in no acute distress. Worrisome signs and symptoms were discussed with the patient, and the patient acknowledged understanding to return to the ED if noticed. Patient was stable upon discharge.          Final Clinical Impression(s) / ED Diagnoses Final diagnoses:  Acute pain of left shoulder    Rx / DC Orders ED Discharge Orders          Ordered    predniSONE (DELTASONE) 20 MG tablet  Daily with breakfast        09/05/23 1325               Peter Garter, [place] 09/05/23 1402    Terrilee Files, MD 09/05/23 1750

## 2023-09-10 DIAGNOSIS — Z114 Encounter for screening for human immunodeficiency virus [HIV]: Secondary | ICD-10-CM | POA: Diagnosis not present

## 2023-09-10 DIAGNOSIS — Z113 Encounter for screening for infections with a predominantly sexual mode of transmission: Secondary | ICD-10-CM | POA: Diagnosis not present

## 2023-09-10 DIAGNOSIS — B3731 Acute candidiasis of vulva and vagina: Secondary | ICD-10-CM | POA: Diagnosis not present

## 2023-09-11 DIAGNOSIS — F4320 Adjustment disorder, unspecified: Secondary | ICD-10-CM | POA: Diagnosis not present

## 2023-09-15 DIAGNOSIS — M25512 Pain in left shoulder: Secondary | ICD-10-CM | POA: Diagnosis not present

## 2023-09-29 DIAGNOSIS — M25512 Pain in left shoulder: Secondary | ICD-10-CM | POA: Diagnosis not present

## 2023-09-29 DIAGNOSIS — M545 Low back pain, unspecified: Secondary | ICD-10-CM | POA: Diagnosis not present

## 2023-09-29 DIAGNOSIS — M549 Dorsalgia, unspecified: Secondary | ICD-10-CM | POA: Diagnosis not present

## 2023-09-29 DIAGNOSIS — M25612 Stiffness of left shoulder, not elsewhere classified: Secondary | ICD-10-CM | POA: Diagnosis not present

## 2023-10-02 DIAGNOSIS — F4320 Adjustment disorder, unspecified: Secondary | ICD-10-CM | POA: Diagnosis not present

## 2023-10-03 DIAGNOSIS — M545 Low back pain, unspecified: Secondary | ICD-10-CM | POA: Diagnosis not present

## 2023-10-09 DIAGNOSIS — M545 Low back pain, unspecified: Secondary | ICD-10-CM | POA: Diagnosis not present

## 2023-10-15 DIAGNOSIS — M25612 Stiffness of left shoulder, not elsewhere classified: Secondary | ICD-10-CM | POA: Diagnosis not present

## 2023-10-15 DIAGNOSIS — M545 Low back pain, unspecified: Secondary | ICD-10-CM | POA: Diagnosis not present

## 2023-10-15 DIAGNOSIS — M549 Dorsalgia, unspecified: Secondary | ICD-10-CM | POA: Diagnosis not present

## 2023-10-15 DIAGNOSIS — M25512 Pain in left shoulder: Secondary | ICD-10-CM | POA: Diagnosis not present

## 2023-10-21 DIAGNOSIS — M25512 Pain in left shoulder: Secondary | ICD-10-CM | POA: Diagnosis not present

## 2023-10-21 DIAGNOSIS — M545 Low back pain, unspecified: Secondary | ICD-10-CM | POA: Diagnosis not present

## 2023-10-21 DIAGNOSIS — M549 Dorsalgia, unspecified: Secondary | ICD-10-CM | POA: Diagnosis not present

## 2023-10-21 DIAGNOSIS — M25612 Stiffness of left shoulder, not elsewhere classified: Secondary | ICD-10-CM | POA: Diagnosis not present

## 2023-10-23 DIAGNOSIS — F411 Generalized anxiety disorder: Secondary | ICD-10-CM | POA: Diagnosis not present

## 2023-10-23 DIAGNOSIS — F4320 Adjustment disorder, unspecified: Secondary | ICD-10-CM | POA: Diagnosis not present

## 2023-10-24 DIAGNOSIS — Z113 Encounter for screening for infections with a predominantly sexual mode of transmission: Secondary | ICD-10-CM | POA: Diagnosis not present

## 2023-11-04 DIAGNOSIS — M25612 Stiffness of left shoulder, not elsewhere classified: Secondary | ICD-10-CM | POA: Diagnosis not present

## 2023-11-04 DIAGNOSIS — M545 Low back pain, unspecified: Secondary | ICD-10-CM | POA: Diagnosis not present

## 2023-11-04 DIAGNOSIS — M25512 Pain in left shoulder: Secondary | ICD-10-CM | POA: Diagnosis not present

## 2023-11-04 DIAGNOSIS — M549 Dorsalgia, unspecified: Secondary | ICD-10-CM | POA: Diagnosis not present

## 2023-11-10 DIAGNOSIS — M545 Low back pain, unspecified: Secondary | ICD-10-CM | POA: Diagnosis not present

## 2023-11-10 DIAGNOSIS — M549 Dorsalgia, unspecified: Secondary | ICD-10-CM | POA: Diagnosis not present

## 2023-11-10 DIAGNOSIS — M25512 Pain in left shoulder: Secondary | ICD-10-CM | POA: Diagnosis not present

## 2023-11-10 DIAGNOSIS — M25612 Stiffness of left shoulder, not elsewhere classified: Secondary | ICD-10-CM | POA: Diagnosis not present

## 2023-11-13 DIAGNOSIS — M542 Cervicalgia: Secondary | ICD-10-CM | POA: Diagnosis not present

## 2023-11-13 DIAGNOSIS — M545 Low back pain, unspecified: Secondary | ICD-10-CM | POA: Diagnosis not present

## 2023-11-19 DIAGNOSIS — M545 Low back pain, unspecified: Secondary | ICD-10-CM | POA: Diagnosis not present

## 2023-11-20 DIAGNOSIS — F4310 Post-traumatic stress disorder, unspecified: Secondary | ICD-10-CM | POA: Diagnosis not present

## 2023-11-24 DIAGNOSIS — M25612 Stiffness of left shoulder, not elsewhere classified: Secondary | ICD-10-CM | POA: Diagnosis not present

## 2023-11-24 DIAGNOSIS — M545 Low back pain, unspecified: Secondary | ICD-10-CM | POA: Diagnosis not present

## 2023-11-24 DIAGNOSIS — M25512 Pain in left shoulder: Secondary | ICD-10-CM | POA: Diagnosis not present

## 2023-11-24 DIAGNOSIS — M549 Dorsalgia, unspecified: Secondary | ICD-10-CM | POA: Diagnosis not present

## 2023-11-25 DIAGNOSIS — M25512 Pain in left shoulder: Secondary | ICD-10-CM | POA: Diagnosis not present

## 2023-11-25 DIAGNOSIS — M542 Cervicalgia: Secondary | ICD-10-CM | POA: Diagnosis not present

## 2023-11-25 DIAGNOSIS — M545 Low back pain, unspecified: Secondary | ICD-10-CM | POA: Diagnosis not present

## 2023-12-30 DIAGNOSIS — F4320 Adjustment disorder, unspecified: Secondary | ICD-10-CM | POA: Diagnosis not present

## 2024-02-12 DIAGNOSIS — F4320 Adjustment disorder, unspecified: Secondary | ICD-10-CM | POA: Diagnosis not present
